# Patient Record
Sex: Female | Born: 1960 | Race: White | Hispanic: No | Marital: Married | Smoking: Former smoker
Health system: Southern US, Community
[De-identification: ages and names within clinical notes are randomized; demographics above are authoritative.]

## PROBLEM LIST (undated history)

## (undated) DIAGNOSIS — R112 Nausea with vomiting, unspecified: Secondary | ICD-10-CM

## (undated) DIAGNOSIS — Z923 Personal history of irradiation: Secondary | ICD-10-CM

## (undated) DIAGNOSIS — E785 Hyperlipidemia, unspecified: Secondary | ICD-10-CM

## (undated) DIAGNOSIS — Z9889 Other specified postprocedural states: Secondary | ICD-10-CM

## (undated) DIAGNOSIS — C50912 Malignant neoplasm of unspecified site of left female breast: Secondary | ICD-10-CM

## (undated) DIAGNOSIS — K219 Gastro-esophageal reflux disease without esophagitis: Secondary | ICD-10-CM

## (undated) DIAGNOSIS — H409 Unspecified glaucoma: Secondary | ICD-10-CM

## (undated) HISTORY — PX: CATARACT EXTRACTION W/ INTRAOCULAR LENS IMPLANT: SHX1309

## (undated) HISTORY — DX: Unspecified glaucoma: H40.9

## (undated) HISTORY — PX: BREAST BIOPSY: SHX20

## (undated) HISTORY — DX: Gastro-esophageal reflux disease without esophagitis: K21.9

## (undated) HISTORY — DX: Personal history of irradiation: Z92.3

## (undated) HISTORY — DX: Hyperlipidemia, unspecified: E78.5

---

## 1997-09-29 ENCOUNTER — Other Ambulatory Visit: Admission: RE | Admit: 1997-09-29 | Discharge: 1997-09-29 | Payer: Self-pay | Admitting: Oncology

## 1998-05-14 ENCOUNTER — Other Ambulatory Visit: Admission: RE | Admit: 1998-05-14 | Discharge: 1998-05-14 | Payer: Self-pay | Admitting: Obstetrics and Gynecology

## 1999-10-08 ENCOUNTER — Other Ambulatory Visit: Admission: RE | Admit: 1999-10-08 | Discharge: 1999-10-08 | Payer: Self-pay | Admitting: Obstetrics and Gynecology

## 2000-11-20 ENCOUNTER — Other Ambulatory Visit: Admission: RE | Admit: 2000-11-20 | Discharge: 2000-11-20 | Payer: Self-pay | Admitting: Obstetrics and Gynecology

## 2001-12-13 ENCOUNTER — Other Ambulatory Visit: Admission: RE | Admit: 2001-12-13 | Discharge: 2001-12-13 | Payer: Self-pay | Admitting: Obstetrics and Gynecology

## 2003-01-16 ENCOUNTER — Other Ambulatory Visit: Admission: RE | Admit: 2003-01-16 | Discharge: 2003-01-16 | Payer: Self-pay | Admitting: Obstetrics and Gynecology

## 2004-01-31 ENCOUNTER — Other Ambulatory Visit: Admission: RE | Admit: 2004-01-31 | Discharge: 2004-01-31 | Payer: Self-pay | Admitting: Obstetrics and Gynecology

## 2005-06-11 ENCOUNTER — Other Ambulatory Visit: Admission: RE | Admit: 2005-06-11 | Discharge: 2005-06-11 | Payer: Self-pay | Admitting: Obstetrics and Gynecology

## 2006-04-07 ENCOUNTER — Ambulatory Visit: Payer: Self-pay | Admitting: Family Medicine

## 2017-01-06 ENCOUNTER — Other Ambulatory Visit: Payer: Self-pay | Admitting: Obstetrics and Gynecology

## 2017-01-06 DIAGNOSIS — R928 Other abnormal and inconclusive findings on diagnostic imaging of breast: Secondary | ICD-10-CM

## 2017-01-23 ENCOUNTER — Other Ambulatory Visit: Payer: Self-pay

## 2017-01-23 ENCOUNTER — Other Ambulatory Visit: Payer: Self-pay | Admitting: Obstetrics and Gynecology

## 2017-01-23 ENCOUNTER — Ambulatory Visit
Admission: RE | Admit: 2017-01-23 | Discharge: 2017-01-23 | Disposition: A | Discharge status: Home / Self Care | Payer: BLUE CROSS/BLUE SHIELD | Source: Ambulatory Visit | Attending: Obstetrics and Gynecology | Admitting: Obstetrics and Gynecology

## 2017-01-23 DIAGNOSIS — R928 Other abnormal and inconclusive findings on diagnostic imaging of breast: Secondary | ICD-10-CM

## 2017-01-23 DIAGNOSIS — N6489 Other specified disorders of breast: Secondary | ICD-10-CM

## 2017-01-24 DIAGNOSIS — C50912 Malignant neoplasm of unspecified site of left female breast: Secondary | ICD-10-CM

## 2017-01-24 HISTORY — DX: Malignant neoplasm of unspecified site of left female breast: C50.912

## 2017-02-10 ENCOUNTER — Ambulatory Visit
Admission: RE | Admit: 2017-02-10 | Discharge: 2017-02-10 | Disposition: A | Discharge status: Home / Self Care | Payer: BLUE CROSS/BLUE SHIELD | Source: Ambulatory Visit | Attending: Obstetrics and Gynecology | Admitting: Obstetrics and Gynecology

## 2017-02-10 ENCOUNTER — Other Ambulatory Visit: Payer: Self-pay | Admitting: Obstetrics and Gynecology

## 2017-02-10 DIAGNOSIS — N6489 Other specified disorders of breast: Secondary | ICD-10-CM

## 2017-02-10 DIAGNOSIS — C50919 Malignant neoplasm of unspecified site of unspecified female breast: Secondary | ICD-10-CM

## 2017-02-10 HISTORY — DX: Malignant neoplasm of unspecified site of unspecified female breast: C50.919

## 2017-02-10 HISTORY — PX: BREAST BIOPSY: SHX20

## 2017-02-12 ENCOUNTER — Telehealth: Payer: Self-pay | Admitting: Hematology

## 2017-02-12 ENCOUNTER — Encounter: Payer: Self-pay | Admitting: *Deleted

## 2017-02-12 NOTE — Telephone Encounter (Signed)
Confirmed with patient [facility] appointment for 9/26 in the afternoon, will mail patient intake form

## 2017-02-17 ENCOUNTER — Other Ambulatory Visit: Payer: Self-pay | Admitting: *Deleted

## 2017-02-17 DIAGNOSIS — Z170 Estrogen receptor positive status [ER+]: Principal | ICD-10-CM

## 2017-02-17 DIAGNOSIS — C50212 Malignant neoplasm of upper-inner quadrant of left female breast: Secondary | ICD-10-CM | POA: Insufficient documentation

## 2017-02-18 ENCOUNTER — Ambulatory Visit (HOSPITAL_BASED_OUTPATIENT_CLINIC_OR_DEPARTMENT_OTHER): Payer: BLUE CROSS/BLUE SHIELD | Admitting: Hematology

## 2017-02-18 ENCOUNTER — Other Ambulatory Visit (HOSPITAL_BASED_OUTPATIENT_CLINIC_OR_DEPARTMENT_OTHER): Payer: BLUE CROSS/BLUE SHIELD

## 2017-02-18 ENCOUNTER — Encounter: Payer: Self-pay | Admitting: Physical Therapy

## 2017-02-18 ENCOUNTER — Ambulatory Visit
Admission: RE | Admit: 2017-02-18 | Discharge: 2017-02-18 | Disposition: A | Discharge status: Home / Self Care | Payer: BLUE CROSS/BLUE SHIELD | Source: Ambulatory Visit | Attending: Radiation Oncology | Admitting: Radiation Oncology

## 2017-02-18 ENCOUNTER — Encounter: Payer: Self-pay | Admitting: Hematology

## 2017-02-18 ENCOUNTER — Ambulatory Visit: Payer: BLUE CROSS/BLUE SHIELD | Attending: Surgery | Admitting: Physical Therapy

## 2017-02-18 ENCOUNTER — Ambulatory Visit: Payer: Self-pay | Admitting: Surgery

## 2017-02-18 VITALS — BP 131/75 | HR 65 | Temp 98.3°F | Resp 18 | Ht 65.0 in | Wt 198.2 lb

## 2017-02-18 DIAGNOSIS — C50212 Malignant neoplasm of upper-inner quadrant of left female breast: Secondary | ICD-10-CM

## 2017-02-18 DIAGNOSIS — Z510 Encounter for antineoplastic radiation therapy: Secondary | ICD-10-CM | POA: Insufficient documentation

## 2017-02-18 DIAGNOSIS — Z803 Family history of malignant neoplasm of breast: Secondary | ICD-10-CM

## 2017-02-18 DIAGNOSIS — Z170 Estrogen receptor positive status [ER+]: Secondary | ICD-10-CM

## 2017-02-18 DIAGNOSIS — R293 Abnormal posture: Secondary | ICD-10-CM | POA: Insufficient documentation

## 2017-02-18 DIAGNOSIS — Z801 Family history of malignant neoplasm of trachea, bronchus and lung: Secondary | ICD-10-CM | POA: Diagnosis not present

## 2017-02-18 DIAGNOSIS — C50312 Malignant neoplasm of lower-inner quadrant of left female breast: Secondary | ICD-10-CM

## 2017-02-18 LAB — CBC WITH DIFFERENTIAL/PLATELET
BASO%: 0.3 % (ref 0.0–2.0)
BASOS ABS: 0 10*3/uL (ref 0.0–0.1)
EOS%: 2.5 % (ref 0.0–7.0)
Eosinophils Absolute: 0.2 10*3/uL (ref 0.0–0.5)
HEMATOCRIT: 40.1 % (ref 34.8–46.6)
HGB: 13.4 g/dL (ref 11.6–15.9)
LYMPH#: 2.3 10*3/uL (ref 0.9–3.3)
LYMPH%: 38.9 % (ref 14.0–49.7)
MCH: 30.8 pg (ref 25.1–34.0)
MCHC: 33.4 g/dL (ref 31.5–36.0)
MCV: 92.2 fL (ref 79.5–101.0)
MONO#: 0.5 10*3/uL (ref 0.1–0.9)
MONO%: 8 % (ref 0.0–14.0)
NEUT#: 3 10*3/uL (ref 1.5–6.5)
NEUT%: 50.3 % (ref 38.4–76.8)
PLATELETS: 163 10*3/uL (ref 145–400)
RBC: 4.35 10*6/uL (ref 3.70–5.45)
RDW: 12.6 % (ref 11.2–14.5)
WBC: 6 10*3/uL (ref 3.9–10.3)

## 2017-02-18 LAB — COMPREHENSIVE METABOLIC PANEL
ALT: 19 U/L (ref 0–55)
ANION GAP: 8 meq/L (ref 3–11)
AST: 20 U/L (ref 5–34)
Albumin: 4.1 g/dL (ref 3.5–5.0)
Alkaline Phosphatase: 55 U/L (ref 40–150)
BUN: 14 mg/dL (ref 7.0–26.0)
CALCIUM: 9.6 mg/dL (ref 8.4–10.4)
CHLORIDE: 106 meq/L (ref 98–109)
CO2: 27 mEq/L (ref 22–29)
Creatinine: 0.8 mg/dL (ref 0.6–1.1)
EGFR: 77 mL/min/{1.73_m2} — AB (ref >90)
Glucose: 95 mg/dl (ref 70–140)
POTASSIUM: 4.3 meq/L (ref 3.5–5.1)
Sodium: 140 mEq/L (ref 136–145)
Total Bilirubin: 0.64 mg/dL (ref 0.20–1.20)
Total Protein: 7.6 g/dL (ref 6.4–8.3)

## 2017-02-18 NOTE — Progress Notes (Signed)
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 female who presents to multidisciplinary breast clinic for evaluation of left-sided breast cancer. Patient underwent routine screening mammogram on 01/05/17 revealing possible distortion in the left breast. She returned for screening recall on 01/23/17 revealing a small area of architectural distortion in the posterior upper left breast at approximately 11 o'clock. Ultrasound showed negative left axilla. biopsy of the upper inner quadrant mass on 02/11/17 revealed invasive ductal carcinoma. Receptor status is ER 90%, PR 0%, Her2-, and Ki67 2%. Patient presents to [facility] to discuss her treatment options and the role of radiation therapy as part of her treatment plan.  Patient complains of breast pain related to biopsy. All other systems are negative.  PREVIOUS RADIATION THERAPY: No  PAST MEDICAL HISTORY:  has a past medical history of Glaucoma.    PAST SURGICAL HISTORY: Past Surgical History:  Procedure Laterality Date  . CATARACT EXTRACTION      FAMILY HISTORY: family history includes Breast cancer in her maternal aunt; Lung cancer in her mother.  SOCIAL HISTORY:  reports that she has quit smoking. She has never used smokeless tobacco. She reports that she drinks about 4.8 oz of alcohol per week .  ALLERGIES: Patient has no allergy information on record.  MEDICATIONS:  No current outpatient prescriptions on file.   No current facility-administered medications for this encounter.     REVIEW OF SYSTEMS:A 10+ point review of systems  was obtained including neurology, dermatology, psychiatry, cardiac, respiratory, lymph, extremities, GI, GU, musculoskeletal, constitutional, reproductive, HEENT. All pertinent positives are noted in the HPI. All others are negative.   PHYSICAL EXAM:  Vitals with BMI 02/18/2017  Height 5' 5"  Weight 198 lbs 3 oz  BMI 82.95  Systolic 621  Diastolic 75  Pulse 65  Respirations 18  Lungs are clear to auscultation bilaterally. Heart has regular rate and rhythm. No palpable cervical, supraclavicular, or axillary adenopathy. Abdomen soft, non-tender, normal bowel sounds. Right breast has no palpable mass or nipple discharge. Left breast has bruising in the upper inner quadrant with no palpable mass, nipple discharge or bleeding.  ECOG = 0  0 - Asymptomatic (Fully active, able to carry on all predisease activities without restriction)  1 - Symptomatic but completely ambulatory (Restricted in physically strenuous activity but ambulatory and able to carry out work of a light or sedentary nature. For example, light housework, office work)  2 - Symptomatic, <50% in bed during the day (Ambulatory and capable of all self care but unable to carry out any work activities. Up and about more than 50% of waking hours)  3 - Symptomatic, >50% in bed, but not bedbound (Capable of only limited self-care, confined to bed or chair 50% or more of waking hours)  4 - Bedbound (Completely disabled. Cannot carry on any self-care. Totally confined to bed or chair)  5 - Death   Eustace Pen MM, Creech RH, Tormey DC, et al. [phone]). "Toxicity and response criteria of the [facility]". [place] Oncol. 5 (6): 649-55  LABORATORY DATA:  Lab Results  Component  Value Date   WBC 6.0 02/18/2017   HGB 13.4 02/18/2017   HCT 40.1 02/18/2017   MCV 92.2 02/18/2017   PLT 163 02/18/2017   NEUTROABS 3.0 02/18/2017   Lab Results  Component Value Date   NA 140 02/18/2017   K 4.3 02/18/2017   CO2 27  02/18/2017   GLUCOSE 95 02/18/2017   CREATININE 0.8 02/18/2017   CALCIUM 9.6 02/18/2017      RADIOGRAPHY: US Breast Ltd Uni Left Inc Axilla  Result Date: 01/23/2017 CLINICAL DATA:  Screening recall for a possible architectural distortion in the left breast. EXAM: 2D DIGITAL DIAGNOSTIC LEFT MAMMOGRAM WITH CAD AND ADJUNCT TOMO ULTRASOUND LEFT BREAST COMPARISON:  Previous exam(s). ACR Breast Density Category c: The breast tissue is heterogeneously dense, which may obscure small masses. FINDINGS: The possible architectural distortion, which lies in the posterior upper left breast, persists on the diagnostic spot-compression 3D CC images. It is not well seen on the diagnostic 3D MLO images. This lies slightly medial to midline in the superior left breast near the 11 o'clock position. There is no discrete mass. There are no suspicious calcifications. Mammographic images were processed with CAD. Targeted ultrasound is performed, showing normal fibroglandular tissue in the upper left breast. No mass, cyst or lesion. No sonographic architectural distortion. Sonographic evaluation of the left axilla shows no enlarged or abnormal lymph nodes. IMPRESSION: Small area of architectural distortion in the posterior upper left breast at approximately 11 o'clock. Tissue sampling is recommended. RECOMMENDATION: Stereotactic core needle biopsy of the area of architectural distortion in the left breast. I have discussed the findings and recommendations with the patient. Results were also provided in writing at the conclusion of the visit. If applicable, a reminder letter will be sent to the patient regarding the next appointment. BI-RADS CATEGORY  4: Suspicious. Electronically Signed   By: Lajean Manes M.D.   On: 01/23/2017 14:55   Mm Diag Breast Tomo Uni Left  Result Date: 01/23/2017 CLINICAL DATA:  Screening recall for a possible architectural distortion in the left breast. EXAM: 2D DIGITAL DIAGNOSTIC LEFT MAMMOGRAM WITH  CAD AND ADJUNCT TOMO ULTRASOUND LEFT BREAST COMPARISON:  Previous exam(s). ACR Breast Density Category c: The breast tissue is heterogeneously dense, which may obscure small masses. FINDINGS: The possible architectural distortion, which lies in the posterior upper left breast, persists on the diagnostic spot-compression 3D CC images. It is not well seen on the diagnostic 3D MLO images. This lies slightly medial to midline in the superior left breast near the 11 o'clock position. There is no discrete mass. There are no suspicious calcifications. Mammographic images were processed with CAD. Targeted ultrasound is performed, showing normal fibroglandular tissue in the upper left breast. No mass, cyst or lesion. No sonographic architectural distortion. Sonographic evaluation of the left axilla shows no enlarged or abnormal lymph nodes. IMPRESSION: Small area of architectural distortion in the posterior upper left breast at approximately 11 o'clock. Tissue sampling is recommended. RECOMMENDATION: Stereotactic core needle biopsy of the area of architectural distortion in the left breast. I have discussed the findings and recommendations with the patient. Results were also provided in writing at the conclusion of the visit. If applicable, a reminder letter will be sent to the patient regarding the next appointment. BI-RADS CATEGORY  4: Suspicious. Electronically Signed   By: Lajean Manes M.D.   On: 01/23/2017 14:55   Mm Clip Placement Left  Result Date: 02/10/2017 CLINICAL DATA:  Patient status post stereotactic guided biopsy left breast distortion. EXAM:  DIAGNOSTIC LEFT MAMMOGRAM POST STEREOTACTIC BIOPSY COMPARISON:  Previous exam(s). FINDINGS: Mammographic images were obtained following stereotactic guided biopsy of left breast distortion. Coil shaped marking clip in appropriate position within the upper-inner left breast posterior depth. Note the focal area of distortion extends approximately 1 cm cranial to the  clip on the true lateral view. IMPRESSION: Biopsy marking clip within appropriate position. Note the distortion extends approximately 1 cm superior to the biopsy marking clip on the ML view. Final Assessment: Post Procedure Mammograms for Marker Placement Electronically Signed   By: Lovey Newcomer M.D.   On: 02/10/2017 10:07   Mm Lt Breast Bx W Loc Dev 1st Lesion Image Bx Spec Stereo Guide  Addendum Date: 02/13/2017   ADDENDUM REPORT: 02/12/2017 11:30 ADDENDUM: Pathology revealed a microscopic focus of grade I invasive ductal carcinoma in the LEFT breast. This was found to be concordant by Dr. Lovey Newcomer. Pathology results were discussed with the patient by telephone. The patient reported doing well after the biopsy with tenderness at the site. Post biopsy instructions and care were reviewed and questions were answered. The patient was encouraged to call [facility] for any additional concerns. The patient was referred to the [facility] at the [facility] on February 18, 2017. Pathology results reported by Susa Raring RN, BSN on 02/12/2017. Electronically Signed   By: Lovey Newcomer M.D.   On: 02/12/2017 11:30   Result Date: 02/13/2017 CLINICAL DATA:  Patient with left breast distortion. EXAM: LEFT BREAST STEREOTACTIC CORE NEEDLE BIOPSY COMPARISON:  Previous exams. FINDINGS: The patient and I discussed the procedure of stereotactic-guided biopsy including benefits and alternatives. We discussed the high likelihood of a successful procedure. We discussed the risks of the procedure including infection, bleeding, tissue injury, clip migration, and inadequate sampling. Informed written consent was given. The usual time out protocol was performed immediately prior to the procedure. Using sterile technique and 1% Lidocaine as local anesthetic, under stereotactic guidance, a 9 gauge vacuum assisted device was used to perform core needle biopsy of  distortion within the upper inner left breast using a cranial approach. Lesion quadrant: Upper inner quadrant At the conclusion of the procedure, a coil shaped tissue marker clip was deployed into the biopsy cavity. Follow-up 2-view mammogram was performed and dictated separately. IMPRESSION: Stereotactic-guided biopsy of left breast distortion. No apparent complications. Electronically Signed: By: Lovey Newcomer M.D. On: 02/10/2017 10:05      IMPRESSION: 56 y.o. woman with stage 1 invasive ductal carcinoma in the upper inner quadrant of the left breast; ER+, PR-, Her 2-, grade 1. Patient would be a good candidate for breast conservation surgery followed by adjuvant radiotherapy. We discussed the course of treatment, side effects, and potential toxicities. The patient appears to understand and wishes to proceed with treatment.    PLAN: Patient will be scheduled for left lumpectomy sentinel node procedure followed by oncotyping, dependent upon the size of the lesion. This will be followed by adjuvant radiation therapy and an aromatase inhibitor.      ------------------------------------------------  Blair Promise, PhD, MD  This document serves as a record of services personally performed by Gery Pray, MD. It was created on his behalf by Bethann Humble, a trained medical scribe. The creation of this record is based on the scribe's personal observations and the provider's statements to them. This document has been checked and approved by the attending provider.

## 2017-02-18 NOTE — H&P (Signed)
[name] 02/18/2017 8:10 AM Location: [facility] Patient #: [id number] DOB: 09-16-1960 Undefined / Language: [name] / Race: Undefined Female  History of Present Illness [name]. Shelby Towle MD; 02/18/2017 3:03 PM) Patient words: Pt sent at the request of Dr Sondra Come for left breast mammographic abnormality. 1 cm area of distortion bx showed IDC ER/PR POS HER 2 NEU NEGATIVE  Pt denies breast pain discharge or change to breast bilaterally              ADDITIONAL INFORMATION: FLUORESCENCE IN-SITU HYBRIDIZATION Results: HER2 - NEGATIVE RATIO OF HER2/CEP17 SIGNALS 1.15 AVERAGE HER2 COPY NUMBER PER CELL 1.90 Reference Range: NEGATIVE HER2/CEP17 Ratio <2.0 and average HER2 copy number <4.0 EQUIVOCAL HER2/CEP17 Ratio <2.0 and average HER2 copy number >=4.0 and <6.0 POSITIVE HER2/CEP17 Ratio >=2.0 or <2.0 and average HER2 copy number >=6.0 Shelby Laws MD Pathologist, Electronic Signature ( Signed 02/16/2017) PROGNOSTIC INDICATORS Results: IMMUNOHISTOCHEMICAL AND MORPHOMETRIC ANALYSIS PERFORMED MANUALLY Estrogen Receptor: 90%, POSITIVE, STRONG STAINING INTENSITY Progesterone Receptor: 0%, NEGATIVE Proliferation Marker Ki67: 2% COMMENT: The negative hormone receptor study(ies) in this case has an internal positive control. REFERENCE RANGE ESTROGEN RECEPTOR NEGATIVE 0% POSITIVE =>1% REFERENCE RANGE PROGESTERONE RECEPTOR NEGATIVE 0% 1 of 3 FINAL for [name] [phone]) ADDITIONAL INFORMATION:(continued) POSITIVE =>1% All controls stained appropriately Shelby Laws MD Pathologist, Electronic Signature ( Signed 02/12/2017) FINAL DIAGNOSIS Diagnosis Breast, left, needle core biopsy, upper inner - INVASIVE DUCTAL CARCINOMA. - SEE COMMENT. Microscopic Comment There is Rivera microscopic focus of grade I invasive ductal carcinoma as confirmed by lack of myoepithelial cells based on smooth muscle myosin, calponin, and p63 stains. Dr [name]  has reviewed the case and concurs with this interpretation. Rivera full breast prognostic profile will be attempted and the results reported separately. The results were called to [facility] on 02/11/17. (JBK:gt:ecj, 02/11/17) Shelby Cutter MD Pathologist, Electronic Signature (Case signed 02/11/2017) Specimen Gross and Clinical Information Specimen Comment TIF: 9:55 AM; extracted < 5 min; distortion Specimen(s) Obtained: Breast, left, needle core biopsy, upper inner Specimen Clinical Information Poss radial scar vs CSL vs IDC Gross Received labeled "[name]" and "[name]" (TIF 0955 CIT <12mn) are multiple cores and irregular pieces of yellow to gray white soft tissue, aggregating 3.2 x 2.7 x 0.3 cm. Four blocks submitted. (SSW 9/18) Stain(s) used in Diagnosis: The following stain(s) were used in diagnosing the case: P63, PR-ACIS, KI-67-ACIS, Her2 FISH, ER-ACIS, Smooth Muscle Myosin - 1 Heavy Chain, Calponin. The control(s) stained appropriately. Disclaimer Ki-67 (MM1), immunohistochemical stains are performed on formalin fixed, paraffin embedded tissue using Rivera 3,3"-diaminobenzidine (DAB) chromogen and Leica Bond Autostainer System. The staining intensity of the nucleus is scored manually and is reported as the percentage of tumor cell nuclei demonstrating specific nuclear staining.Specimens are fixed in 10% Neutral Buffered Formalin for at least 6 hours and up to 72 hours. These tests have not be validated on decalcified tissue. Results should be interpreted with caution given the possibility of false negative results on 2 of 3 FINAL for [name] ([phone] Disclaimer(continued) decalcified specimens. Estrogen receptor (6F11), immunohistochemical stains are performed on formalin fixed, paraffin embedded tissue using Rivera 3,3"-diaminobenzidine (DAB) chromogen and Leica Bond Autostainer System. The staining intensity of the nucleus  is scored manually and is reported as the percentage of tumor cell nuclei demonstrating specific nuclear staining.Specimens are fixed in 10% Neutral Buffered Formalin for at least 6 hours and up to 72 hours. These tests have not be validated on  decalcified tissue. Results should be interpreted with caution given the possibility of false negative results on decalcified specimens. HER2 IQFISH pharmDX (code [phone]) is Rivera direct fluorescence in-situ hybridization assay designed to quantitatively determine HER2 gene amplification in formalin-fixed, paraffin-embedded tissue specimens. It is performed at [facility] and is reported using ASCO/CAP scoring criteria published in 2013. Some of these immunohistochemical stains may have been developed and the performance characteristics determined by [facility]. Some may not have been cleared or approved by the U.S. Food and Drug Administration. The FDA has determined that such clearance or approval is not necessary. This test is used for clinical purposes. It should not be regarded as investigational or for research. This laboratory is certified under the [place] (CLIA-88) as qualified to perform high complexity clinical laboratory testing. PR progesterone receptor (16), immunohistochemical stains are performed on formalin fixed, paraffin embedded tissue using Rivera 3,3"-diaminobenzidine (DAB) chromogen and Leica Bond Autostainer System. The staining intensity of the nucleus is scored manually and is reported as the percentage of tumor cell nuclei demonstrating specific nuclear staining.Specimens are fixed in 10% Neutral Buffered Formalin for at least 6 hours and up to 72 hours. These tests have not be validated on decalcified tissue. Results should be interpreted with caution given the possibility of false negative results on decalcified specimens. Report signed out from the following  location(s) Technical Component was performed at [facility]. [address],[place] [address].[id number]., Technical Component was performed at [facility] [address], [place], [place] [address]. CLIA #: [id number], Interpretation was performed at [facility], [place], [place] [address]. CLIA #: [id number], 3 of           CLINICAL DATA: Screening recall for Rivera possible architectural distortion in the left breast.  EXAM: 2D DIGITAL DIAGNOSTIC LEFT MAMMOGRAM WITH CAD AND ADJUNCT TOMO  ULTRASOUND LEFT BREAST  COMPARISON: Previous exam(s).  ACR Breast Density Category c: The breast tissue is heterogeneously dense, which may obscure small masses.  FINDINGS: The possible architectural distortion, which lies in the posterior upper left breast, persists on the diagnostic spot-compression 3D CC images. It is not well seen on the diagnostic 3D MLO images. This lies slightly medial to midline in the superior left breast near the 11 o'clock position. There is no discrete mass. There are no suspicious calcifications.  Mammographic images were processed with CAD.  Targeted ultrasound is performed, showing normal fibroglandular tissue in the upper left breast. No mass, cyst or lesion. No sonographic architectural distortion. Sonographic evaluation of the left axilla shows no enlarged or abnormal lymph nodes.  IMPRESSION: Small area of architectural distortion in the posterior upper left breast at approximately 11 o'clock. Tissue sampling is recommended.  RECOMMENDATION: Stereotactic core needle biopsy of the area of architectural distortion in the left breast.  I have discussed the findings and recommendations with the patient. Results were also provided in writing at the conclusion of the visit. If applicable, Rivera reminder letter will be sent to the patient regarding the next  appointment.  BI-RADS CATEGORY 4: Suspicious.   Electronically Signed By: Lajean Manes M.D. On: 01/23/2017 14:55.  The patient is Rivera 56 year old female.    Review of Systems (Aliah Eriksson Rivera. Dhiya Smits MD; 02/18/2017 2:57 PM) General Not Present- Appetite Loss, Chills, Fatigue, Fever, Night Sweats, Weight Gain and Weight Loss. Note: All other systems negative (unless as noted in HPI & included Review of Systems) Skin Not  Present- Change in Wart/Mole, Dryness, Hives, Jaundice, New Lesions, Non-Healing Wounds, Rash and Ulcer. Respiratory Not Present- Bloody sputum, Chronic Cough, Difficulty Breathing, Snoring and Wheezing. Cardiovascular Not Present- Chest Pain, Difficulty Breathing Lying Down, Leg Cramps, Palpitations, Rapid Heart Rate, Shortness of Breath and Swelling of Extremities. Gastrointestinal Not Present- Abdominal Pain, Bloating, Bloody Stool, Change in Bowel Habits, Chronic diarrhea, Constipation, Difficulty Swallowing, Excessive gas, Gets full quickly at meals, Hemorrhoids, Indigestion, Nausea, Rectal Pain and Vomiting. Musculoskeletal Not Present- Back Pain, Joint Pain, Joint Stiffness, Muscle Pain, Muscle Weakness and Swelling of Extremities. Neurological Not Present- Decreased Memory, Fainting, Headaches, Numbness, Seizures, Tingling, Tremor, Trouble walking and Weakness. Endocrine Not Present- Cold Intolerance, Excessive Hunger, Hair Changes, Heat Intolerance, Hot flashes and New Diabetes. Hematology Not Present- Easy Bruising, Excessive bleeding, Gland problems, HIV and Persistent Infections.   Physical Exam (Kiosha Buchan Rivera. Jasenia Weilbacher MD; 02/18/2017 3:04 PM)  General Mental Status-Alert. General Appearance-Consistent with stated age. Hydration-Well hydrated. Voice-Normal.  Head and Neck Head-normocephalic, atraumatic with no lesions or palpable masses. Trachea-midline. Thyroid Gland Characteristics - normal size and consistency.  Eye Eyeball -  Bilateral-Extraocular movements intact. Sclera/Conjunctiva - Bilateral-No scleral icterus.  Breast Note: bruising left breast no mass  right breast normal  Cardiovascular Cardiovascular examination reveals -normal heart sounds, regular rate and rhythm with no murmurs and normal pedal pulses bilaterally.  Musculoskeletal Normal Exam - Left-Upper Extremity Strength Normal and Lower Extremity Strength Normal. Normal Exam - Right-Upper Extremity Strength Normal and Lower Extremity Strength Normal.  Lymphatic Head & Neck  General Head & Neck Lymphatics: Bilateral - Description - Normal. Axillary  General Axillary Region: Bilateral - Description - Normal. Tenderness - Non Tender.    Assessment & Plan (Solana Coggin Rivera. Montrell Cessna MD; 02/18/2017 3:05 PM)  BREAST CANCER, LEFT (C50.912) Impression: Pt has opted for left breast partial mastectomy and SLN mapping Risk of lumpectomy include bleeding, infection, seroma, more surgery, use of seed/wire, wound care, cosmetic deformity and the need for other treatments, death , blood clots, death. Pt agrees to proceed. Risk of sentinel lymph node mapping include bleeding, infection, lymphedema, shoulder pain. stiffness, dye allergy. cosmetic deformity , blood clots, death, need for more surgery. Pt agres to proceed.  Current Plans You are being scheduled for surgery- Our schedulers will call you.  You should hear from our office's scheduling department within 5 working days about the location, date, and time of surgery. We try to make accommodations for patient's preferences in scheduling surgery, but sometimes the OR schedule or the surgeon's schedule prevents [place] from making those accommodations.  If you have not heard from our office [phone]) in 5 working days, call the office and ask for your surgeon's nurse.  If you have other questions about your diagnosis, plan, or surgery, call the office and ask for your surgeon's nurse.  Pt  Education - [facility] Breast Cancer Information Given - [facility] "Breast Journey" Package We discussed the staging and pathophysiology of breast cancer. We discussed all of the different options for treatment for breast cancer including surgery, chemotherapy, radiation therapy, Herceptin, and antiestrogen therapy. We discussed Rivera sentinel lymph node biopsy as she does not appear to having lymph node involvement right now. We discussed the performance of that with injection of radioactive tracer and blue dye. We discussed that she would have an incision underneath her axillary hairline. We discussed that there is Rivera bout Rivera 10-20% chance of having Rivera positive node with Rivera sentinel lymph node biopsy and we will await the permanent pathology to make  any other first further decisions in terms of her treatment. One of these options might be to return to the operating room to perform an axillary lymph node dissection. We discussed about Rivera 1-2% risk lifetime of chronic shoulder pain as well as lymphedema associated with Rivera sentinel lymph node biopsy. We discussed the options for treatment of the breast cancer which included lumpectomy versus Rivera mastectomy. We discussed the performance of the lumpectomy with Rivera wire placement. We discussed Rivera 10-20% chance of Rivera positive margin requiring reexcision in the operating room. We also discussed that she may need radiation therapy or antiestrogen therapy or both if she undergoes lumpectomy. We discussed the mastectomy and the postoperative care for that as well. We discussed that there is no difference in her survival whether she undergoes lumpectomy with radiation therapy or antiestrogen therapy versus Rivera mastectomy. There is Rivera slight difference in the local recurrence rate being 3-5% with lumpectomy and about 1% with Rivera mastectomy. We discussed the risks of operation including bleeding, infection, possible reoperation. She understands her further therapy will be based on what her stages at the  time of her operation.  Pt Education - [facility] breast cancer surgery: discussed with patient and provided information. Pt Education - ABC (After Breast Cancer) Class Info: discussed with patient and provided information.

## 2017-02-18 NOTE — Progress Notes (Signed)
[name]  [phone] Fax:([phone]) [place] Note   Patient Care Team: Hali Marry, MD as PCP - General Erroll Luna, MD as Consulting Physician (General Surgery) Truitt Merle, MD as Consulting Physician (Hematology) Gery Pray, MD as Consulting Physician (Radiation Oncology) 02/18/2017  CHIEF COMPLAINTS/PURPOSE OF CONSULTATION:  Left Breast Cancer    Oncology History   Cancer Staging Malignant neoplasm of upper-inner quadrant of left breast in female, estrogen receptor positive ([place]) Staging form: Breast, AJCC 8th Edition - Clinical stage from 02/10/2017: Stage Unknown (cTX, cN0, cM0, G1, ER: Positive, PR: Negative, HER2: Negative) - Signed by Truitt Merle, MD on 02/17/2017       Malignant neoplasm of upper-inner quadrant of left breast in female, estrogen receptor positive ([place])   01/23/2017 Mammogram    DIAGNOSTIC MAMMOGRAM AND US IMPRESSION: Small area of architectural distortion in the posterior upper left breast at approximately 11 o'clock. Tissue sampling is recommended. [place] of left breast and axilla was negative       02/10/2017 Receptors her2    Estrogen Receptor: 90%, POSITIVE, STRONG STAINING INTENSITY Progesterone Receptor: 0%, NEGATIVE HER2 NOT AMPLIFIED  Proliferation Marker Ki67: 2%       02/10/2017 Initial Biopsy    Breast, left, needle core biopsy, upper inner - INVASIVE DUCTAL CARCINOMA. GRADE 1      02/10/2017 Initial Diagnosis    Malignant neoplasm of upper-inner quadrant of left breast in female, estrogen receptor positive ([place])        HISTORY OF PRESENTING ILLNESS: 02/18/17 [name] 56 y.o. female is here because of newly diagnosed left breast cancer.  She presents to the Breast clinic today with her husband.   In the past she was diagnosed with glaucoma. She had a ovarian pregnancy in 1985 but was still able to have 2 children afterward. She smoked several years ago but no  longer. Her maternal aunt had breast cancer in her 53s. Her mother had lung caner from smoking.   Today she reports this lump was found by screening mammogram and she had yearly screening before that were normal. She denies any body change in weight, breast appearence, energy or appetite.  She takes vitamin Fish oil.    GYN HISTORY  Menarchal: 13 LMP: age 52 Contraceptive: 1977-2009 HRT: No GP: G5P3A2, miscarriage and ovarian pregnancy. 56 yo at first pregnancy. She has 1 daughter 1 son.     MEDICAL HISTORY:  Past Medical History:  Diagnosis Date  . Glaucoma     SURGICAL HISTORY: Past Surgical History:  Procedure Laterality Date  . CATARACT EXTRACTION      SOCIAL HISTORY: Social History   Social History  . Marital status: Married    Spouse name: N/A  . Number of children: N/A  . Years of education: N/A   Occupational History  . Not on file.   Social History Main Topics  . Smoking status: Former Research scientist (life sciences)  . Smokeless tobacco: Never Used  . Alcohol use 4.8 oz/week    8 Glasses of wine per week  . Drug use: Unknown  . Sexual activity: Not on file   Other Topics Concern  . Not on file   Social History Narrative  . No narrative on file    FAMILY HISTORY: Family History  Problem Relation Age of Onset  . Lung cancer Mother   . Breast cancer Maternal Aunt     ALLERGIES:  has no allergies on file.  MEDICATIONS:  No current outpatient  prescriptions on file.   No current facility-administered medications for this visit.     REVIEW OF SYSTEMS:   Constitutional: Denies fevers, chills or abnormal night sweats Eyes: Denies blurriness of vision, double vision or watery eyes Ears, nose, mouth, throat, and face: Denies mucositis or sore throat Respiratory: Denies cough, dyspnea or wheezes Cardiovascular: Denies palpitation, chest discomfort or lower extremity swelling Gastrointestinal:  Denies nausea, heartburn or change in bowel habits Skin: Denies abnormal  skin rashes Lymphatics: Denies new lymphadenopathy or easy bruising Neurological:Denies numbness, tingling or new weaknesses Behavioral/Psych: Mood is stable, no new changes  All other systems were reviewed with the patient and are negative.  PHYSICAL EXAMINATION: ECOG PERFORMANCE STATUS: 0 - Asymptomatic  Vitals:   02/18/17 1252  BP: 131/75  Pulse: 65  Resp: 18  Temp: 98.3 F (36.8 C)  SpO2: 100%   Filed Weights   02/18/17 1252  Weight: 198 lb 3.2 oz (89.9 kg)    GENERAL:alert, no distress and comfortable SKIN: skin color, texture, turgor are normal, no rashes or significant lesions EYES: normal, conjunctiva are pink and non-injected, sclera clear OROPHARYNX:no exudate, no erythema and lips, buccal mucosa, and tongue normal  NECK: supple, thyroid normal size, non-tender, without nodularity LYMPH:  no palpable lymphadenopathy in the cervical, axillary or inguinal LUNGS: clear to auscultation and percussion with normal breathing effort HEART: regular rate & rhythm and no murmurs and no lower extremity edema ABDOMEN:abdomen soft, non-tender and normal bowel sounds Musculoskeletal:no cyanosis of digits and no clubbing  PSYCH: alert & oriented x 3 with fluent speech NEURO: no focal motor/sensory deficits Breast: (+) Left breast has bruise at biopsy site, no palpable mass or adenopathy.   LABORATORY DATA:  I have reviewed the data as listed CBC Latest Ref Rng & Units 02/18/2017  WBC 3.9 - 10.3 10e3/uL 6.0  Hemoglobin 11.6 - 15.9 g/dL 13.4  Hematocrit 34.8 - 46.6 % 40.1  Platelets 145 - 400 10e3/uL 163    CMP Latest Ref Rng & Units 02/18/2017  Glucose 70 - 140 mg/dl 95  BUN 7.0 - 26.0 mg/dL 14.0  Creatinine 0.6 - 1.1 mg/dL 0.8  Sodium 136 - 145 mEq/L 140  Potassium 3.5 - 5.1 mEq/L 4.3  CO2 22 - 29 mEq/L 27  Calcium 8.4 - 10.4 mg/dL 9.6  Total Protein 6.4 - 8.3 g/dL 7.6  Total Bilirubin 0.20 - 1.20 mg/dL 0.64  Alkaline Phos 40 - 150 U/L 55  AST 5 - 34 U/L 20  ALT 0 -  55 U/L 19    PATHOLOGY   Diagnosis Breast, left, needle core biopsy, upper inner - INVASIVE DUCTAL CARCINOMA. - SEE COMMENT. Microscopic Comment There is a microscopic focus of grade I invasive ductal carcinoma as confirmed by lack of myoepithelial cells based on smooth muscle myosin, calponin, and p63 stains. Dr Claudette Laws has reviewed the case and concurs with this interpretation. A full breast prognostic profile will be attempted and the results reported separately. The results were called to [facility] on 02/11/17. (JBK:gt:ecj, 02/11/17) FLUORESCENCE IN-SITU HYBRIDIZATION Results: HER2 - NEGATIVE RATIO OF HER2/CEP17 SIGNALS 1.15 AVERAGE HER2 COPY NUMBER PER CELL 1.90 PROGNOSTIC INDICATORS Results: IMMUNOHISTOCHEMICAL AND MORPHOMETRIC ANALYSIS PERFORMED MANUALLY Estrogen Receptor: 90%, POSITIVE, STRONG STAINING INTENSITY Progesterone Receptor: 0%, NEGATIVE Proliferation Marker Ki67: 2%    RADIOGRAPHIC STUDIES: I have personally reviewed the radiological images as listed and agreed with the findings in the report. US Breast Ltd Uni Left Inc Axilla  Result Date: 01/23/2017 CLINICAL DATA:  Screening recall for a possible architectural distortion in the left breast. EXAM: 2D DIGITAL DIAGNOSTIC LEFT MAMMOGRAM WITH CAD AND ADJUNCT TOMO ULTRASOUND LEFT BREAST COMPARISON:  Previous exam(s). ACR Breast Density Category c: The breast tissue is heterogeneously dense, which may obscure small masses. FINDINGS: The possible architectural distortion, which lies in the posterior upper left breast, persists on the diagnostic spot-compression 3D CC images. It is not well seen on the diagnostic 3D MLO images. This lies slightly medial to midline in the superior left breast near the 11 o'clock position. There is no discrete mass. There are no suspicious calcifications. Mammographic images were processed with CAD. Targeted ultrasound is performed, showing normal fibroglandular  tissue in the upper left breast. No mass, cyst or lesion. No sonographic architectural distortion. Sonographic evaluation of the left axilla shows no enlarged or abnormal lymph nodes. IMPRESSION: Small area of architectural distortion in the posterior upper left breast at approximately 11 o'clock. Tissue sampling is recommended. RECOMMENDATION: Stereotactic core needle biopsy of the area of architectural distortion in the left breast. I have discussed the findings and recommendations with the patient. Results were also provided in writing at the conclusion of the visit. If applicable, a reminder letter will be sent to the patient regarding the next appointment. BI-RADS CATEGORY  4: Suspicious. Electronically Signed   By: Lajean Manes M.D.   On: 01/23/2017 14:55   Mm Diag Breast Tomo Uni Left  Result Date: 01/23/2017 CLINICAL DATA:  Screening recall for a possible architectural distortion in the left breast. EXAM: 2D DIGITAL DIAGNOSTIC LEFT MAMMOGRAM WITH CAD AND ADJUNCT TOMO ULTRASOUND LEFT BREAST COMPARISON:  Previous exam(s). ACR Breast Density Category c: The breast tissue is heterogeneously dense, which may obscure small masses. FINDINGS: The possible architectural distortion, which lies in the posterior upper left breast, persists on the diagnostic spot-compression 3D CC images. It is not well seen on the diagnostic 3D MLO images. This lies slightly medial to midline in the superior left breast near the 11 o'clock position. There is no discrete mass. There are no suspicious calcifications. Mammographic images were processed with CAD. Targeted ultrasound is performed, showing normal fibroglandular tissue in the upper left breast. No mass, cyst or lesion. No sonographic architectural distortion. Sonographic evaluation of the left axilla shows no enlarged or abnormal lymph nodes. IMPRESSION: Small area of architectural distortion in the posterior upper left breast at approximately 11 o'clock. Tissue sampling  is recommended. RECOMMENDATION: Stereotactic core needle biopsy of the area of architectural distortion in the left breast. I have discussed the findings and recommendations with the patient. Results were also provided in writing at the conclusion of the visit. If applicable, a reminder letter will be sent to the patient regarding the next appointment. BI-RADS CATEGORY  4: Suspicious. Electronically Signed   By: Lajean Manes M.D.   On: 01/23/2017 14:55   Mm Clip Placement Left  Result Date: 02/10/2017 CLINICAL DATA:  Patient status post stereotactic guided biopsy left breast distortion. EXAM: DIAGNOSTIC LEFT MAMMOGRAM POST STEREOTACTIC BIOPSY COMPARISON:  Previous exam(s). FINDINGS: Mammographic images were obtained following stereotactic guided biopsy of left breast distortion. Coil shaped marking clip in appropriate position within the upper-inner left breast posterior depth. Note the focal area of distortion extends approximately 1 cm cranial to the clip on the true lateral view. IMPRESSION: Biopsy marking clip within appropriate position. Note the distortion extends approximately 1 cm superior to the biopsy marking clip on the ML view. Final Assessment: Post Procedure Mammograms for Marker Placement Electronically Signed  By: Lovey Newcomer M.D.   On: 02/10/2017 10:07   Mm Lt Breast Bx W Loc Dev 1st Lesion Image Bx Spec Stereo Guide  Addendum Date: 02/13/2017   ADDENDUM REPORT: 02/12/2017 11:30 ADDENDUM: Pathology revealed a microscopic focus of grade I invasive ductal carcinoma in the LEFT breast. This was found to be concordant by Dr. Lovey Newcomer. Pathology results were discussed with the patient by telephone. The patient reported doing well after the biopsy with tenderness at the site. Post biopsy instructions and care were reviewed and questions were answered. The patient was encouraged to call [facility] for any additional concerns. The patient was referred to the [facility] at the [facility] on February 18, 2017. Pathology results reported by Susa Raring RN, BSN on 02/12/2017. Electronically Signed   By: Lovey Newcomer M.D.   On: 02/12/2017 11:30   Result Date: 02/13/2017 CLINICAL DATA:  Patient with left breast distortion. EXAM: LEFT BREAST STEREOTACTIC CORE NEEDLE BIOPSY COMPARISON:  Previous exams. FINDINGS: The patient and I discussed the procedure of stereotactic-guided biopsy including benefits and alternatives. We discussed the high likelihood of a successful procedure. We discussed the risks of the procedure including infection, bleeding, tissue injury, clip migration, and inadequate sampling. Informed written consent was given. The usual time out protocol was performed immediately prior to the procedure. Using sterile technique and 1% Lidocaine as local anesthetic, under stereotactic guidance, a 9 gauge vacuum assisted device was used to perform core needle biopsy of distortion within the upper inner left breast using a cranial approach. Lesion quadrant: Upper inner quadrant At the conclusion of the procedure, a coil shaped tissue marker clip was deployed into the biopsy cavity. Follow-up 2-view mammogram was performed and dictated separately. IMPRESSION: Stereotactic-guided biopsy of left breast distortion. No apparent complications. Electronically Signed: By: Lovey Newcomer M.D. On: 02/10/2017 10:05    ASSESSMENT & PLAN:  [name] is a 56 y.o. female with  A history of glaucoma.    1. Malignant neoplasm of upper-inner quadrant of left breast, invasive ductal carcinoma, c(TX, N0, M0), likely stage I, ER positive, PR Negative, HER2 Negative, Grade 1 -We discussed her imaging findings and the biopsy results in great details. -Giving the early stage disease, she likely need a lumpectomy and sentinel lymph node biopsy. She is agreeable with that. She was seen by Dr. Brantley Stage today and likely will proceed with  surgery soon.  -I recommend a Oncotype Dx test on the surgical sample if invasive cancer>16m,  and we'll make a decision about adjuvant chemotherapy based on the Oncotype result. Based on low grade and low KI67,  it is likely low risk disease and she will not need chemotherapy. Written material of this test was given to her. She is young and fit, would be a good candidate for chemotherapy if her Oncotype recurrence score is high. -If her surgical sentinel lymph node node positive, I recommend mammaprint for further risk stratification and guide adjuvant chemotherapy. -Giving the strong ER positive and PR negative expression in her postmenopausal status, I recommend adjuvant endocrine therapy with aromatase inhibitor for a total of 5-10 years to reduce the risk of cancer recurrence. Potential benefits and side effects were discussed with patient. She is concerned about those potential side effects, and react to take it. She will make a decision after the oncotype result returns  -She was also seen by radiation oncologist Dr. KSondra Cometoday. Adjuvant breast radiation after  lumpectomy was recommended.  -We also discussed the breast cancer surveillance after her surgery. She will continue annual screening mammogram, self exam, and a routine office visit with lab and exam with [place]. -I encouraged her to have healthy diet and exercise regularly.  -Labs reviewed and her CBC and CMP and Liver function are with in normal limits.  -Will f/u after surgery and radiation or sooner is oncocyte comes back high risk.   PLAN:  -Proceed with surgery lumpectomy and sentinel lymph node biopsy  -will order oncotype on surgical sample if invasive cancer more than 5 mm, all mammaprint if node positive -f/u after radiation, sooner if oncotype results show as high risk   No orders of the defined types were placed in this encounter.   All questions were answered. The patient knows to call the clinic with any problems,  questions or concerns. I spent 55 minutes counseling the patient face to face. The total time spent in the appointment was 60 minutes and more than 50% was on counseling.     Truitt Merle, MD 02/18/2017 5:04 PM   This document serves as a record of services personally performed by Truitt Merle, MD. It was created on her behalf by Joslyn Devon, a trained medical scribe. The creation of this record is based on the scribe's personal observations and the provider's statements to them. This document has been checked and approved by the attending provider.

## 2017-02-18 NOTE — Therapy (Addendum)
[place], [place], [address] Phone: [phone]   Fax:  [phone]  Physical Therapy Evaluation  Patient Details  Name: [name] MRN: [id number] Date of Birth: April 05, 1961 Referring Provider: Dr. Erroll Luna  Encounter Date: 02/18/2017      PT End of Session - 02/18/17 1523    Visit Number 1   Number of Visits 2   Date for PT Re-Evaluation 04/15/17   PT Start Time 1456   PT Stop Time 1516   PT Time Calculation (min) 20 min   Activity Tolerance Patient tolerated treatment well   Behavior During Therapy [facility] for tasks assessed/performed      Past Medical History:  Diagnosis Date  . Glaucoma     Past Surgical History:  Procedure Laterality Date  . CATARACT EXTRACTION      There were no vitals filed for this visit.       Subjective Assessment - 02/18/17 1518    Subjective Patient reports she is here today to be seen by her medical team for her newly diagnosed left breast cancer.   Patient is accompained by: Family member   Pertinent History Patient was diagnosed on 01/05/17 with left grade 1 invasive ductal carcinoma breast cancer. It is a distortion located in the upper innr quadrant with a Ki67 of 2%. It is ER positive, PR negative and HER2 negative.   Patient Stated Goals reduce lymphedema risk and learn post op shoulder ROM HEP   Currently in Pain? No/denies            [facility] PT Assessment - 02/18/17 0001      Assessment   Medical Diagnosis Left breast cancer   Referring Provider Dr. Marcello Moores [place]   Onset Date/Surgical Date 01/05/17   Hand Dominance Right   Prior Therapy none     Precautions   Precautions Other (comment)   Precaution Comments active cancer     Restrictions   Weight Bearing Restrictions No     Balance Screen   Has the patient fallen in the past 6 months No   Has the patient had a decrease in activity level because of a fear of falling?  No    Is the patient reluctant to leave their home because of a fear of falling?  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   Leisure She does not exercise     Cognition   Overall Cognitive Status Within Functional Limits for tasks assessed     Posture/Postural Control   Posture/Postural Control Postural limitations   Postural Limitations Rounded Shoulders;Forward head     ROM / Strength   AROM / PROM / Strength AROM;Strength     AROM   AROM Assessment Site Shoulder;Cervical   Right/Left Shoulder Right;Left   Right Shoulder Extension 58 Degrees   Right Shoulder Flexion 143 Degrees   Right Shoulder ABduction 157 Degrees   Right Shoulder Internal Rotation 87 Degrees   Right Shoulder External Rotation 76 Degrees   Left Shoulder Extension 54 Degrees   Left Shoulder Flexion 140 Degrees   Left Shoulder ABduction 152 Degrees   Left Shoulder Internal Rotation 75 Degrees   Left Shoulder External Rotation 79 Degrees  Cervical Flexion WNL   Cervical Extension WNL   Cervical - Right Side Bend WNL   Cervical - Left Side Bend WNL   Cervical - Right Rotation WNL   Cervical - Left Rotation WNL     Strength   Overall Strength Within functional limits for tasks performed           LYMPHEDEMA/ONCOLOGY QUESTIONNAIRE - 02/18/17 1522      Type   Cancer Type Left breast cancer     Lymphedema Assessments   Lymphedema Assessments Upper extremities     Right Upper Extremity Lymphedema   10 cm Proximal to Olecranon Process 34.3 cm   Olecranon Process 26.6 cm   10 cm Proximal to Ulnar Styloid Process 24.5 cm   Just Proximal to Ulnar Styloid Process 15.8 cm   Across Hand at [employer] 17.5 cm   At [place] of 2nd Digit 5.9 cm     Left Upper  Extremity Lymphedema   10 cm Proximal to Olecranon Process 33.4 cm   Olecranon Process 26.7 cm   10 cm Proximal to Ulnar Styloid Process 22.9 cm   Just Proximal to Ulnar Styloid Process 15.7 cm   Across Hand at [employer] 17.5 cm   At [place] of 2nd Digit 5.9 cm         Objective measurements completed on examination: See above findings.     Patient was instructed today in a home exercise program today for post op shoulder range of motion. These included active assist shoulder flexion in sitting, scapular retraction, wall walking with shoulder abduction, and hands behind head external rotation.  She was encouraged to do these twice a day, holding 3 seconds and repeating 5 times when permitted by her physician.                 PT Education - 02/18/17 1523    Education provided Yes   Education Details Lymphedema risk reduction and post op shoulder ROM HEP   Person(s) Educated Patient;Spouse   Methods Explanation;Demonstration;Handout   Comprehension Returned demonstration;Verbalized understanding              Breast Clinic Goals - 02/18/17 1525      Patient will be able to verbalize understanding of pertinent lymphedema risk reduction practices relevant to her diagnosis specifically related to skin care.   Time 1   Period Days   Status Achieved     Patient will be able to return demonstrate and/or verbalize understanding of the post-op home exercise program related to regaining shoulder range of motion.   Time 1   Period Days   Status Achieved     Patient will be able to verbalize understanding of the importance of attending the postoperative After Breast Cancer Class for further lymphedema risk reduction education and therapeutic exercise.   Time 1   Period Days   Status Achieved               Plan - 02/18/17 1523    Clinical Impression Statement Patient was diagnosed on 01/05/17 with left grade 1 invasive ductal carcinoma breast cancer. It is  a distortion located in the upper innr quadrant with a Ki67 of 2%. It is ER positive, PR negative and HER2 negative. Her multidisciplinary medical team met prior to her assessments to determine a recommended treatment plan. She is planning to have a left lumpectomy and sentinel node biopsy followed by Oncotype testing, radiation and anti-estrogen therapy. She may benefit from post  op PT to regain shoulder ROM and reduce lympehdema risk.   History and Personal Factors relevant to plan of care: None   Clinical Presentation Stable   Rehab Potential Excellent   Clinical Impairments Affecting Rehab Potential None   PT Frequency --  eval and 1 post op visit 3-4 after surgery   PT Treatment/Interventions ADLs/Self Care Home Management;Therapeutic exercise;Patient/family education   PT Next Visit Plan Will f.u 3-4 weeks post op to determine PT needs and reassess pt   PT Home Exercise Plan Post op shoulder ROM HEP   Consulted and Agree with Plan of Care Patient;Family member/caregiver   Family Member Consulted Husband      Patient will benefit from skilled therapeutic intervention in order to improve the following deficits and impairments:  Decreased range of motion, Impaired UE functional use, Pain, Decreased knowledge of precautions, Postural dysfunction  Visit Diagnosis: Carcinoma of upper-inner quadrant of left breast in female, estrogen receptor positive ([place]) - Plan: PT plan of care cert/re-cert  Abnormal posture - Plan: PT plan of care cert/re-cert   Patient will follow up at outpatient cancer rehab if needed following surgery.  If the patient requires physical therapy at that time, a specific plan will be dictated and sent to the referring physician for approval. The patient was educated today on appropriate basic range of motion exercises to begin post operatively and the importance of attending the After Breast Cancer class following surgery.  Patient was educated today on lymphedema risk  reduction practices as it pertains to recommendations that will benefit the patient immediately following surgery.  She verbalized good understanding.  No additional physical therapy is indicated at this time.      Problem List Patient Active Problem List   Diagnosis Date Noted  . Malignant neoplasm of upper-inner quadrant of left breast in female, estrogen receptor positive ([place]) 02/17/2017    Annia Friendly, PT 02/18/17 3:27 PM  [place], [place], [address] Phone: [phone]   Fax:  [phone]  Name: [name] MRN: [id number] Date of Birth: Dec 31, 1960   PHYSICAL THERAPY DISCHARGE SUMMARY  Visits from Start of Care: 1  Current functional level related to goals / functional outcomes: Unknown. Patient did not come for a f/u visit post operatively.   Remaining deficits: Unknown.   Education / Equipment: Lymphedema risk reduction and HEP education.  Plan: Patient agrees to discharge.  Patient goals were partially met. Patient is being discharged due to not returning since the last visit.  ?????         Annia Friendly, [place] 06/29/17 11:49 AM

## 2017-02-18 NOTE — Patient Instructions (Signed)

## 2017-02-20 ENCOUNTER — Other Ambulatory Visit: Payer: Self-pay | Admitting: *Deleted

## 2017-02-20 ENCOUNTER — Encounter: Payer: Self-pay | Admitting: General Practice

## 2017-02-20 ENCOUNTER — Telehealth: Payer: Self-pay | Admitting: Hematology

## 2017-02-20 NOTE — Progress Notes (Signed)
[place] Psychosocial Distress Screening [place] presented to Breast Multidisciplinary Clinic. The patient scored a 1 on the Psychosocial Distress Thermometer which indicates mild distress.   ONCBCN DISTRESS SCREENING 02/20/2017  Screening Type Initial Screening  Distress experienced in past week (1-10) 1  Referral to support programs Yes    Follow up needed: No. Pt received full packet of [place], but please also page if immediate needs arise.  Thank you.   [employer], [place], [facility] Pager [phone] Voicemail [phone]

## 2017-02-20 NOTE — Telephone Encounter (Signed)
No 9/26 los.   

## 2017-02-23 ENCOUNTER — Telehealth: Payer: Self-pay | Admitting: *Deleted

## 2017-02-23 HISTORY — PX: BREAST LUMPECTOMY: SHX2

## 2017-02-23 NOTE — Telephone Encounter (Signed)
Left vm for pt to return call regarding [place] from 02/18/17. Contact information provided.

## 2017-02-23 NOTE — Telephone Encounter (Signed)
Spoke to pt regarding [place] from 9.26.18. Denies questions or concerns regarding dx or treatment care plan. Encourage pt to call with needs. Received verbal understanding. Contact information provided.

## 2017-03-02 ENCOUNTER — Other Ambulatory Visit: Payer: Self-pay | Admitting: Surgery

## 2017-03-02 DIAGNOSIS — Z170 Estrogen receptor positive status [ER+]: Principal | ICD-10-CM

## 2017-03-02 DIAGNOSIS — C50312 Malignant neoplasm of lower-inner quadrant of left female breast: Secondary | ICD-10-CM

## 2017-03-17 ENCOUNTER — Encounter (HOSPITAL_BASED_OUTPATIENT_CLINIC_OR_DEPARTMENT_OTHER): Payer: Self-pay | Admitting: *Deleted

## 2017-03-17 NOTE — Pre-Procedure Instructions (Signed)
To come pick up Ensure pre-surgery drink 10 oz. - to drink by 0400 DOS. 

## 2017-03-23 ENCOUNTER — Ambulatory Visit
Admission: RE | Admit: 2017-03-23 | Discharge: 2017-03-23 | Disposition: A | Discharge status: Home / Self Care | Payer: BLUE CROSS/BLUE SHIELD | Source: Ambulatory Visit | Attending: Surgery | Admitting: Surgery

## 2017-03-23 DIAGNOSIS — Z170 Estrogen receptor positive status [ER+]: Principal | ICD-10-CM

## 2017-03-23 DIAGNOSIS — C50312 Malignant neoplasm of lower-inner quadrant of left female breast: Secondary | ICD-10-CM

## 2017-03-23 HISTORY — PX: BREAST LUMPECTOMY: SHX2

## 2017-03-23 NOTE — Progress Notes (Signed)
Pt in to pick up pre op drink, instructions reviewed.

## 2017-03-24 ENCOUNTER — Ambulatory Visit (HOSPITAL_BASED_OUTPATIENT_CLINIC_OR_DEPARTMENT_OTHER): Payer: BLUE CROSS/BLUE SHIELD | Admitting: Certified Registered"

## 2017-03-24 ENCOUNTER — Encounter (HOSPITAL_BASED_OUTPATIENT_CLINIC_OR_DEPARTMENT_OTHER): Payer: Self-pay | Admitting: Certified Registered"

## 2017-03-24 ENCOUNTER — Ambulatory Visit (HOSPITAL_BASED_OUTPATIENT_CLINIC_OR_DEPARTMENT_OTHER)
Admission: RE | Admit: 2017-03-24 | Discharge: 2017-03-24 | Disposition: A | Discharge status: Home / Self Care | Payer: BLUE CROSS/BLUE SHIELD | Source: Ambulatory Visit | Attending: Surgery | Admitting: Surgery

## 2017-03-24 ENCOUNTER — Ambulatory Visit
Admission: RE | Admit: 2017-03-24 | Discharge: 2017-03-24 | Disposition: A | Discharge status: Home / Self Care | Payer: BLUE CROSS/BLUE SHIELD | Source: Ambulatory Visit | Attending: Surgery | Admitting: Surgery

## 2017-03-24 ENCOUNTER — Encounter (HOSPITAL_BASED_OUTPATIENT_CLINIC_OR_DEPARTMENT_OTHER)
Admission: RE | Disposition: A | Discharge status: Home / Self Care | Payer: Self-pay | Source: Ambulatory Visit | Attending: Surgery

## 2017-03-24 ENCOUNTER — Ambulatory Visit (HOSPITAL_COMMUNITY)
Admission: RE | Admit: 2017-03-24 | Discharge: 2017-03-24 | Disposition: A | Discharge status: Home / Self Care | Payer: BLUE CROSS/BLUE SHIELD | Source: Ambulatory Visit | Attending: Surgery | Admitting: Surgery

## 2017-03-24 DIAGNOSIS — Z170 Estrogen receptor positive status [ER+]: Principal | ICD-10-CM

## 2017-03-24 DIAGNOSIS — C50212 Malignant neoplasm of upper-inner quadrant of left female breast: Secondary | ICD-10-CM | POA: Insufficient documentation

## 2017-03-24 DIAGNOSIS — C50312 Malignant neoplasm of lower-inner quadrant of left female breast: Secondary | ICD-10-CM

## 2017-03-24 DIAGNOSIS — Z87891 Personal history of nicotine dependence: Secondary | ICD-10-CM | POA: Diagnosis not present

## 2017-03-24 DIAGNOSIS — Z6834 Body mass index (BMI) 34.0-34.9, adult: Secondary | ICD-10-CM | POA: Insufficient documentation

## 2017-03-24 DIAGNOSIS — E669 Obesity, unspecified: Secondary | ICD-10-CM | POA: Insufficient documentation

## 2017-03-24 HISTORY — PX: BREAST LUMPECTOMY WITH RADIOACTIVE SEED AND SENTINEL LYMPH NODE BIOPSY: SHX6550

## 2017-03-24 HISTORY — DX: Malignant neoplasm of unspecified site of left female breast: C50.912

## 2017-03-24 HISTORY — DX: Nausea with vomiting, unspecified: R11.2

## 2017-03-24 HISTORY — DX: Other specified postprocedural states: Z98.890

## 2017-03-24 SURGERY — BREAST LUMPECTOMY WITH RADIOACTIVE SEED AND SENTINEL LYMPH NODE BIOPSY
Anesthesia: Regional | Site: Breast | Laterality: Left

## 2017-03-24 MED ORDER — IBUPROFEN 800 MG PO TABS: 800.0000 mg | ORAL_TABLET | Freq: Three times a day (TID) | ORAL | 0 refills | Status: DC | PRN

## 2017-03-24 MED ORDER — MIDAZOLAM HCL 2 MG/2ML IJ SOLN
INTRAMUSCULAR | Status: AC
  Filled 2017-03-24: qty 2

## 2017-03-24 MED ORDER — MIDAZOLAM HCL 2 MG/2ML IJ SOLN
1.0000 mg | INTRAMUSCULAR | Status: DC | PRN
  Administered 2017-03-24 (×2): 1 mg via INTRAVENOUS

## 2017-03-24 MED ORDER — BUPIVACAINE-EPINEPHRINE (PF) 0.25% -1:200000 IJ SOLN
INTRAMUSCULAR | Status: DC | PRN
  Administered 2017-03-24: 20 mL

## 2017-03-24 MED ORDER — CHLORHEXIDINE GLUCONATE CLOTH 2 % EX PADS: 6.0000 | MEDICATED_PAD | Freq: Once | CUTANEOUS | Status: DC

## 2017-03-24 MED ORDER — GABAPENTIN 300 MG PO CAPS
300.0000 mg | ORAL_CAPSULE | ORAL | Status: AC
  Administered 2017-03-24: 300 mg via ORAL

## 2017-03-24 MED ORDER — LIDOCAINE HCL (CARDIAC) 20 MG/ML IV SOLN
INTRAVENOUS | Status: DC | PRN
  Administered 2017-03-24: 30 mg via INTRAVENOUS

## 2017-03-24 MED ORDER — FENTANYL CITRATE (PF) 100 MCG/2ML IJ SOLN
50.0000 ug | INTRAMUSCULAR | Status: AC | PRN
  Administered 2017-03-24: 75 ug via INTRAVENOUS
  Administered 2017-03-24: 50 ug via INTRAVENOUS
  Administered 2017-03-24: 25 ug via INTRAVENOUS

## 2017-03-24 MED ORDER — CEFAZOLIN SODIUM-DEXTROSE 2-4 GM/100ML-% IV SOLN: 2.0000 g | Freq: Once | INTRAVENOUS | Status: DC

## 2017-03-24 MED ORDER — METHYLENE BLUE 0.5 % INJ SOLN
INTRAVENOUS | Status: AC
  Filled 2017-03-24: qty 10

## 2017-03-24 MED ORDER — FENTANYL CITRATE (PF) 100 MCG/2ML IJ SOLN
INTRAMUSCULAR | Status: AC
  Filled 2017-03-24: qty 2

## 2017-03-24 MED ORDER — LACTATED RINGERS IV SOLN
INTRAVENOUS | Status: DC
  Administered 2017-03-24 (×2): via INTRAVENOUS

## 2017-03-24 MED ORDER — ACETAMINOPHEN 500 MG PO TABS
ORAL_TABLET | ORAL | Status: AC
  Filled 2017-03-24: qty 2

## 2017-03-24 MED ORDER — ONDANSETRON HCL 4 MG/2ML IJ SOLN
INTRAMUSCULAR | Status: DC | PRN
  Administered 2017-03-24: 4 mg via INTRAVENOUS

## 2017-03-24 MED ORDER — CELECOXIB 200 MG PO CAPS
200.0000 mg | ORAL_CAPSULE | ORAL | Status: AC
  Administered 2017-03-24: 200 mg via ORAL

## 2017-03-24 MED ORDER — PROPOFOL 10 MG/ML IV BOLUS
INTRAVENOUS | Status: DC | PRN
  Administered 2017-03-24: 200 mg via INTRAVENOUS

## 2017-03-24 MED ORDER — ROPIVACAINE HCL 5 MG/ML IJ SOLN
INTRAMUSCULAR | Status: DC | PRN
  Administered 2017-03-24: 30 mL via PERINEURAL

## 2017-03-24 MED ORDER — CELECOXIB 200 MG PO CAPS
ORAL_CAPSULE | ORAL | Status: AC
  Filled 2017-03-24: qty 1

## 2017-03-24 MED ORDER — FENTANYL CITRATE (PF) 100 MCG/2ML IJ SOLN: 25.0000 ug | INTRAMUSCULAR | Status: DC | PRN

## 2017-03-24 MED ORDER — OXYCODONE HCL 5 MG PO TABS: 5.0000 mg | ORAL_TABLET | Freq: Four times a day (QID) | ORAL | 0 refills | Status: DC | PRN

## 2017-03-24 MED ORDER — ACETAMINOPHEN 500 MG PO TABS
1000.0000 mg | ORAL_TABLET | ORAL | Status: AC
  Administered 2017-03-24: 1000 mg via ORAL

## 2017-03-24 MED ORDER — ONDANSETRON HCL 4 MG/2ML IJ SOLN: 4.0000 mg | Freq: Once | INTRAMUSCULAR | Status: DC | PRN

## 2017-03-24 MED ORDER — TECHNETIUM TC 99M SULFUR COLLOID FILTERED
1.0000 | Freq: Once | INTRAVENOUS | Status: AC | PRN
  Administered 2017-03-24: 1 via INTRADERMAL

## 2017-03-24 MED ORDER — SODIUM CHLORIDE 0.9 % IJ SOLN
INTRAMUSCULAR | Status: AC
  Filled 2017-03-24: qty 10

## 2017-03-24 MED ORDER — CEFAZOLIN SODIUM 10 G IJ SOLR
3.0000 g | INTRAMUSCULAR | Status: DC
  Administered 2017-03-24: 2 g via INTRAVENOUS

## 2017-03-24 MED ORDER — EPHEDRINE SULFATE 50 MG/ML IJ SOLN
INTRAMUSCULAR | Status: DC | PRN
  Administered 2017-03-24: 10 mg via INTRAVENOUS

## 2017-03-24 MED ORDER — CEFAZOLIN SODIUM-DEXTROSE 2-4 GM/100ML-% IV SOLN
INTRAVENOUS | Status: AC
  Filled 2017-03-24: qty 100

## 2017-03-24 MED ORDER — SCOPOLAMINE 1 MG/3DAYS TD PT72
1.0000 | MEDICATED_PATCH | Freq: Once | TRANSDERMAL | Status: AC
  Administered 2017-03-24: 1 via TRANSDERMAL

## 2017-03-24 MED ORDER — GABAPENTIN 300 MG PO CAPS
ORAL_CAPSULE | ORAL | Status: AC
Start: 2017-03-24 — End: 2017-03-24
  Filled 2017-03-24: qty 1

## 2017-03-24 MED ORDER — BUPIVACAINE-EPINEPHRINE (PF) 0.25% -1:200000 IJ SOLN
INTRAMUSCULAR | Status: AC
  Filled 2017-03-24: qty 150

## 2017-03-24 MED ORDER — DEXAMETHASONE SODIUM PHOSPHATE 4 MG/ML IJ SOLN
INTRAMUSCULAR | Status: DC | PRN
  Administered 2017-03-24: 10 mg via INTRAVENOUS

## 2017-03-24 SURGICAL SUPPLY — 51 items
ADH SKN CLS APL DERMABOND .7 (GAUZE/BANDAGES/DRESSINGS) ×1
APPLIER CLIP 9.375 MED OPEN (MISCELLANEOUS) ×2
APR CLP MED 9.3 20 MLT OPN (MISCELLANEOUS) ×1
BINDER BREAST LRG (GAUZE/BANDAGES/DRESSINGS) IMPLANT
BINDER BREAST MEDIUM (GAUZE/BANDAGES/DRESSINGS) IMPLANT
BINDER BREAST XLRG (GAUZE/BANDAGES/DRESSINGS) ×1 IMPLANT
BINDER BREAST XXLRG (GAUZE/BANDAGES/DRESSINGS) IMPLANT
BLADE SURG 15 STRL LF DISP TIS (BLADE) ×1 IMPLANT
BLADE SURG 15 STRL SS (BLADE) ×2
CANISTER SUC SOCK COL 7IN (MISCELLANEOUS) IMPLANT
CANISTER SUCT 1200ML W/VALVE (MISCELLANEOUS) ×2 IMPLANT
CHLORAPREP W/TINT 26ML (MISCELLANEOUS) ×2 IMPLANT
CLIP APPLIE 9.375 MED OPEN (MISCELLANEOUS) ×1 IMPLANT
COVER BACK TABLE 60X90IN (DRAPES) ×2 IMPLANT
COVER MAYO STAND STRL (DRAPES) ×2 IMPLANT
COVER PROBE W GEL 5X96 (DRAPES) ×2 IMPLANT
DECANTER SPIKE VIAL GLASS SM (MISCELLANEOUS) IMPLANT
DERMABOND ADVANCED (GAUZE/BANDAGES/DRESSINGS) ×1
DERMABOND ADVANCED .7 DNX12 (GAUZE/BANDAGES/DRESSINGS) ×1 IMPLANT
DEVICE DUBIN W/COMP PLATE 8390 (MISCELLANEOUS) ×2 IMPLANT
DRAPE LAPAROSCOPIC ABDOMINAL (DRAPES) ×2 IMPLANT
DRAPE UTILITY XL STRL (DRAPES) ×2 IMPLANT
ELECT COATED BLADE 2.86 ST (ELECTRODE) ×2 IMPLANT
ELECT REM PT RETURN 9FT ADLT (ELECTROSURGICAL) ×2
ELECTRODE REM PT RTRN 9FT ADLT (ELECTROSURGICAL) ×1 IMPLANT
GLOVE BIOGEL PI IND STRL 8 (GLOVE) ×1 IMPLANT
GLOVE BIOGEL PI INDICATOR 8 (GLOVE) ×1
GLOVE SURG SS PI 6.5 STRL IVOR (GLOVE) ×1 IMPLANT
GLOVE SURG SS PI 7.0 STRL IVOR (GLOVE) ×1 IMPLANT
GLOVE SURG SS PI 8.0 STRL IVOR (GLOVE) ×1 IMPLANT
GOWN STRL REUS W/ TWL LRG LVL3 (GOWN DISPOSABLE) ×2 IMPLANT
GOWN STRL REUS W/TWL LRG LVL3 (GOWN DISPOSABLE) ×4
HEMOSTAT ARISTA ABSORB 3G PWDR (MISCELLANEOUS) IMPLANT
HEMOSTAT SNOW SURGICEL 2X4 (HEMOSTASIS) ×1 IMPLANT
KIT MARKER MARGIN INK (KITS) ×2 IMPLANT
NDL HYPO 25X1 1.5 SAFETY (NEEDLE) ×1 IMPLANT
NDL SAFETY ECLIPSE 18X1.5 (NEEDLE) IMPLANT
NEEDLE HYPO 18GX1.5 SHARP (NEEDLE)
NEEDLE HYPO 25X1 1.5 SAFETY (NEEDLE) ×2 IMPLANT
NS IRRIG 1000ML POUR BTL (IV SOLUTION) ×2 IMPLANT
PACK BASIN DAY SURGERY FS (CUSTOM PROCEDURE TRAY) ×2 IMPLANT
PENCIL BUTTON HOLSTER BLD 10FT (ELECTRODE) ×2 IMPLANT
SLEEVE SCD COMPRESS KNEE MED (MISCELLANEOUS) ×2 IMPLANT
SPONGE LAP 4X18 X RAY DECT (DISPOSABLE) ×2 IMPLANT
SUT MNCRL AB 4-0 PS2 18 (SUTURE) ×2 IMPLANT
SUT VICRYL 3-0 CR8 SH (SUTURE) ×2 IMPLANT
SYR CONTROL 10ML LL (SYRINGE) ×2 IMPLANT
TOWEL OR 17X24 6PK STRL BLUE (TOWEL DISPOSABLE) ×2 IMPLANT
TOWEL OR NON WOVEN STRL DISP B (DISPOSABLE) ×2 IMPLANT
TUBE CONNECTING 20X1/4 (TUBING) ×2 IMPLANT
YANKAUER SUCT BULB TIP NO VENT (SUCTIONS) ×2 IMPLANT

## 2017-03-24 NOTE — Transfer of Care (Signed)
Immediate Anesthesia Transfer of Care Note  Patient: Asharia Lotter Hamed  Procedure(s) Performed: BREAST PARTIAL MASTECTOMY WITH RADIOACTIVE SEED AND SENTINEL LYMPH NODE BIOPSY (Left Breast)  Patient Location: PACU  Anesthesia Type:GA combined with regional for post-op pain  Level of Consciousness: awake, alert , oriented and patient cooperative  Airway & Oxygen Therapy: Patient Spontanous Breathing and Patient connected to face mask oxygen  Post-op Assessment: Report given to RN and Post -op Vital signs reviewed and stable  Post vital signs: Reviewed and stable  Last Vitals:  Vitals:   03/24/17 0727 03/24/17 0728  BP: 120/87   Pulse: 63 63  Resp: 11 13  Temp:    SpO2: 100% 100%    Last Pain:  Vitals:   03/24/17 0650  TempSrc: Oral         Complications: No apparent anesthesia complications

## 2017-03-24 NOTE — Anesthesia Procedure Notes (Signed)
Procedure Name: LMA Insertion Date/Time: 03/24/2017 7:42 AM Performed by: Doretta Remmert D Pre-anesthesia Checklist: Patient identified, Emergency Drugs available, Suction available and Patient being monitored Patient Re-evaluated:Patient Re-evaluated prior to induction Oxygen Delivery Method: Circle system utilized Preoxygenation: Pre-oxygenation with 100% oxygen Induction Type: IV induction Ventilation: Mask ventilation without difficulty LMA: LMA inserted LMA Size: 3.0 Number of attempts: 1 Airway Equipment and Method: Bite block Placement Confirmation: positive ETCO2 Tube secured with: Tape Dental Injury: Teeth and Oropharynx as per pre-operative assessment

## 2017-03-24 NOTE — Anesthesia Preprocedure Evaluation (Addendum)
Anesthesia Evaluation  Patient identified by MRN, date of birth, ID band Patient awake    Reviewed: Allergy & Precautions, NPO status , Patient's Chart, lab work & pertinent test results  History of Anesthesia Complications (+) PONV and history of anesthetic complications  Airway Mallampati: II  TM Distance: >3 FB Neck ROM: Full    Dental no notable dental hx.    Pulmonary former smoker,    Pulmonary exam normal breath sounds clear to auscultation       Cardiovascular negative cardio ROS Normal cardiovascular exam Rhythm:Regular Rate:Normal     Neuro/Psych negative neurological ROS  negative psych ROS   GI/Hepatic negative GI ROS, Neg liver ROS,   Endo/Other  negative endocrine ROS  Renal/GU negative Renal ROS     Musculoskeletal negative musculoskeletal ROS (+)   Abdominal (+) + obese,   Peds  Hematology negative hematology ROS (+)   Anesthesia Other Findings LEFT BREAST CANCER  Reproductive/Obstetrics                            Anesthesia Physical Anesthesia Plan  ASA: II  Anesthesia Plan: General and Regional   Post-op Pain Management: GA combined w/ Regional for post-op pain   Induction: Intravenous  PONV Risk Score and Plan: 4 or greater and Ondansetron, Dexamethasone, Midazolam and Scopolamine patch - Pre-op  Airway Management Planned: LMA  Additional Equipment:   Intra-op Plan:   Post-operative Plan: Extubation in OR  Informed Consent: I have reviewed the patients History and Physical, chart, labs and discussed the procedure including the risks, benefits and alternatives for the proposed anesthesia with the patient or authorized representative who has indicated his/her understanding and acceptance.   Dental advisory given  Plan Discussed with: CRNA  Anesthesia Plan Comments:        Anesthesia Quick Evaluation

## 2017-03-24 NOTE — Interval H&P Note (Signed)
History and Physical Interval Note:  03/24/2017 7:18 AM  [name]  has presented today for surgery, with the diagnosis of LEFT BREAST CANCER  The various methods of treatment have been discussed with the patient and family. After consideration of risks, benefits and other options for treatment, the patient has consented to  Procedure(s): BREAST PARTIAL MASTECTOMY WITH RADIOACTIVE SEED AND SENTINEL LYMPH NODE BIOPSY (Left) as a surgical intervention .  The patient's history has been reviewed, patient examined, no change in status, stable for surgery.  I have reviewed the patient's chart and labs.  Questions were answered to the patient's satisfaction.     Eustacia Urbanek A.

## 2017-03-24 NOTE — Discharge Instructions (Signed)
[facility] °Office Phone Number [phone] ° °BREAST BIOPSY/ PARTIAL MASTECTOMY: POST OP INSTRUCTIONS ° °Always review your discharge instruction sheet given to you by the facility where your surgery was performed. ° °IF YOU HAVE DISABILITY OR FAMILY LEAVE FORMS, YOU MUST BRING THEM TO THE OFFICE FOR PROCESSING.  DO NOT GIVE THEM TO YOUR DOCTOR. ° °1. A prescription for pain medication may be given to you upon discharge.  Take your pain medication as prescribed, if needed.  If narcotic pain medicine is not needed, then you may take acetaminophen (Tylenol) or ibuprofen (Advil) as needed. °2. Take your usually prescribed medications unless otherwise directed °3. If you need a refill on your pain medication, please contact your pharmacy.  They will contact our office to request authorization.  Prescriptions will not be filled after 5pm or on week-ends. °4. You should eat very light the first 24 hours after surgery, such as soup, crackers, pudding, etc.  Resume your normal diet the day after surgery. °5. Most patients will experience some swelling and bruising in the breast.  Ice packs and a good support bra will help.  Swelling and bruising can take several days to resolve.  °6. It is common to experience some constipation if taking pain medication after surgery.  Increasing fluid intake and taking a stool softener will usually help or prevent this problem from occurring.  A mild laxative (Milk of Magnesia or Miralax) should be taken according to package directions if there are no bowel movements after 48 hours. °7. Unless discharge instructions indicate otherwise, you may remove your bandages 24-48 hours after surgery, and you may shower at that time.  You may have steri-strips (small skin tapes) in place directly over the incision.  These strips should be left on the skin for 7-10 days.  If your surgeon used skin glue on the incision, you may shower in 24 hours.  The glue will flake off over the  next 2-3 weeks.  Any sutures or staples will be removed at the office during your follow-up visit. °8. ACTIVITIES:  You may resume regular daily activities (gradually increasing) beginning the next day.  Wearing a good support bra or sports bra minimizes pain and swelling.  You may have sexual intercourse when it is comfortable. °a. You may drive when you no longer are taking prescription pain medication, you can comfortably wear a seatbelt, and you can safely maneuver your car and apply brakes. °b. RETURN TO WORK:  ______________________________________________________________________________________ °9. You should see your doctor in the office for a follow-up appointment approximately two weeks after your surgery.  Your doctor’s nurse will typically make your follow-up appointment when she calls you with your pathology report.  Expect your pathology report 2-3 business days after your surgery.  You may call to check if you do not hear from us after three days. °10. OTHER INSTRUCTIONS: _______________________________________________________________________________________________ _____________________________________________________________________________________________________________________________________ °_____________________________________________________________________________________________________________________________________ °_____________________________________________________________________________________________________________________________________ ° °WHEN TO CALL YOUR DOCTOR: °1. Fever over 101.0 °2. Nausea and/or vomiting. °3. Extreme swelling or bruising. °4. Continued bleeding from incision. °5. Increased pain, redness, or drainage from the incision. ° °The clinic staff is available to answer your questions during regular business hours.  Please don’t hesitate to call and ask to speak to one of the nurses for clinical concerns.  If you have a medical emergency, go to the nearest  emergency room or call 911.  A surgeon from [facility] is always on call at the hospital. ° °For further questions, please visit [email or web address]  ° ° ° ° °  Post Anesthesia Home Care Instructions ° °Activity: °Get plenty of rest for the remainder of the day. A responsible individual must stay with you for 24 hours following the procedure.  °For the next 24 hours, DO NOT: °-Drive a car °-Operate machinery °-Drink alcoholic beverages °-Take any medication unless instructed by your physician °-Make any legal decisions or sign important papers. ° °Meals: °Start with liquid foods such as gelatin or soup. Progress to regular foods as tolerated. Avoid greasy, spicy, heavy foods. If nausea and/or vomiting occur, drink only clear liquids until the nausea and/or vomiting subsides. Call your physician if vomiting continues. ° °Special Instructions/Symptoms: °Your throat may feel dry or sore from the anesthesia or the breathing tube placed in your throat during surgery. If this causes discomfort, gargle with warm salt water. The discomfort should disappear within 24 hours. ° °If you had a scopolamine patch placed behind your ear for the management of post- operative nausea and/or vomiting: ° °1. The medication in the patch is effective for 72 hours, after which it should be removed.  Wrap patch in a tissue and discard in the trash. Wash hands thoroughly with soap and water. °2. You may remove the patch earlier than 72 hours if you experience unpleasant side effects which may include dry mouth, dizziness or visual disturbances. °3. Avoid touching the patch. Wash your hands with soap and water after contact with the patch. °  ° °

## 2017-03-24 NOTE — Progress Notes (Signed)
Emotional support during breast injections °

## 2017-03-24 NOTE — H&P (Signed)
[name]  Location: [facility] Patient #: [id number] DOB: 16-Apr-1961 Undefined / Language: [name] / Race: Undefined Female  History of Present Illness Shelby Moores Rivera. Ronnie Mallette MD Patient words: Pt sent at Shelby request of Dr Sondra Come for left breast mammographic abnormality. 1 cm area of distortion bx showed IDC ER/PR POS HER 2 NEU NEGATIVE  Pt denies breast pain discharge or change to breast bilaterally              ADDITIONAL INFORMATION: FLUORESCENCE IN-SITU HYBRIDIZATION Results: HER2 - NEGATIVE RATIO OF HER2/CEP17 SIGNALS 1.15 AVERAGE HER2 COPY NUMBER PER CELL 1.90 Reference Range: NEGATIVE HER2/CEP17 Ratio <2.0 and average HER2 copy number <4.0 EQUIVOCAL HER2/CEP17 Ratio <2.0 and average HER2 copy number >=4.0 and <6.0 POSITIVE HER2/CEP17 Ratio >=2.0 or <2.0 and average HER2 copy number >=6.0 Shelby Laws MD Pathologist, Electronic Signature ( Signed 02/16/2017) PROGNOSTIC INDICATORS Results: IMMUNOHISTOCHEMICAL AND MORPHOMETRIC ANALYSIS PERFORMED MANUALLY Estrogen Receptor: 90%, POSITIVE, STRONG STAINING INTENSITY Progesterone Receptor: 0%, NEGATIVE Proliferation Marker Ki67: 2% COMMENT: Shelby negative hormone receptor study(ies) in this Rivera has an internal positive control. REFERENCE RANGE ESTROGEN RECEPTOR NEGATIVE 0% POSITIVE =>1% REFERENCE RANGE PROGESTERONE RECEPTOR NEGATIVE 0% 1 of 3 FINAL for [name] [phone]) ADDITIONAL INFORMATION:(continued) POSITIVE =>1% All controls stained appropriately Shelby Laws MD Pathologist, Electronic Signature ( Signed 02/12/2017) FINAL DIAGNOSIS Diagnosis Breast, left, needle core biopsy, upper inner - INVASIVE DUCTAL CARCINOMA. - SEE COMMENT. Microscopic Comment There is Rivera microscopic focus of grade I invasive ductal carcinoma as confirmed by lack of myoepithelial cells based on smooth muscle myosin, calponin, and p63 stains. Dr [name]  and concurs with this interpretation. Rivera full breast prognostic profile will be attempted and Shelby results reported separately. Shelby results were called to [name] on 02/11/17. (JBK:gt:ecj, 02/11/17) Shelby Cutter MD Pathologist, Electronic Signature (Rivera signed 02/11/2017) Specimen Gross and Clinical Information Specimen Comment TIF: 9:55 AM; extracted < 5 min; distortion Specimen(s) Obtained: Breast, left, needle core biopsy, upper inner Specimen Clinical Information Poss radial scar vs CSL vs IDC Gross Received labeled "Pain,[name]" and "[name]" (TIF 0955 CIT <12mn) are multiple cores and irregular pieces of yellow to gray white soft tissue, aggregating 3.2 x 2.7 x 0.3 cm. Four blocks submitted. (SSW 9/18) Stain(s) used in Diagnosis: Shelby following stain(s) were used in diagnosing [name]: P63, PR-ACIS, KI-67-ACIS, Her2 FISH, ER-ACIS, Smooth Muscle Myosin - 1 Heavy Chain, Calponin. Shelby control(s) stained appropriately. Disclaimer Ki-67 (MM1), immunohistochemical stains are performed on formalin fixed, paraffin embedded tissue using Rivera 3,3"-diaminobenzidine (DAB) chromogen and Leica Bond Autostainer System. Shelby staining intensity of Shelby nucleus is scored manually and is reported as Shelby percentage of tumor cell nuclei demonstrating specific nuclear staining.Specimens are fixed in 10% Neutral Buffered Formalin for at least 6 hours and up to 72 hours. These tests have not be validated on decalcified tissue. Results should be interpreted with caution given Shelby possibility of false negative results on 2 of 3 FINAL for FSHANDI, GODFREYA ([phone] Disclaimer(continued) decalcified specimens. Estrogen receptor (6F11), immunohistochemical stains are performed on formalin fixed, paraffin embedded tissue using Rivera 3,3"-diaminobenzidine (DAB) chromogen and Leica Bond Autostainer System. Shelby staining intensity of Shelby nucleus is scored manually and  is reported as Shelby percentage of tumor cell nuclei demonstrating specific nuclear staining.Specimens are fixed in 10% Neutral Buffered Formalin for at least 6 hours and up to 72 hours. These tests have not be validated on decalcified tissue. Results should be  interpreted with caution given Shelby possibility of false negative results on decalcified specimens. HER2 IQFISH pharmDX (code [id number]) is Rivera direct fluorescence in-situ hybridization assay designed to quantitatively determine HER2 gene amplification in formalin-fixed, paraffin-embedded tissue specimens. It is performed at [facility] and is reported using ASCO/CAP scoring criteria published in 2013. Some of these immunohistochemical stains may have been developed and Shelby performance characteristics determined by [facility]. Some may not have been cleared or approved by Shelby [place] Food and Drug Administration. Shelby FDA has determined that such clearance or approval is not necessary. This test is used for clinical purposes. It should not be regarded as investigational or for research. This laboratory is certified under Shelby [place] (CLIA-88) as qualified to perform high complexity clinical laboratory testing. PR progesterone receptor (16), immunohistochemical stains are performed on formalin fixed, paraffin embedded tissue using Rivera 3,3"-diaminobenzidine (DAB) chromogen and Leica Bond Autostainer System. Shelby staining intensity of Shelby nucleus is scored manually and is reported as Shelby percentage of tumor cell nuclei demonstrating specific nuclear staining.Specimens are fixed in 10% Neutral Buffered Formalin for at least 6 hours and up to 72 hours. These tests have not be validated on decalcified tissue. Results should be interpreted with caution given Shelby possibility of false negative results on decalcified specimens. Report signed out from Shelby following location(s) Technical Component was  performed at [facility]. [name].HYQM:[id number],[id number]., Technical Component was performed at [facility] [place], [place], [place] [address]. CLIA #: [id number], Interpretation was performed at [place], [place],  [address]. CLIA #: [id number], 3 of           CLINICAL DATA: Screening recall for Rivera possible architectural distortion in Shelby left breast.  EXAM: 2D DIGITAL DIAGNOSTIC LEFT MAMMOGRAM WITH CAD AND ADJUNCT TOMO  ULTRASOUND LEFT BREAST  COMPARISON: Previous exam(s).  ACR Breast Density Category c: Shelby breast tissue is heterogeneously dense, which may obscure small masses.  FINDINGS: Shelby possible architectural distortion, which lies in Shelby posterior upper left breast, persists on Shelby diagnostic spot-compression 3D CC images. It is not well seen on Shelby diagnostic 3D MLO images. This lies slightly medial to midline in Shelby superior left breast near Shelby 11 o'clock position. There is no discrete mass. There are no suspicious calcifications.  Mammographic images were processed with CAD.  Targeted ultrasound is performed, showing normal fibroglandular tissue in Shelby upper left breast. No mass, cyst or lesion. No sonographic architectural distortion. Sonographic evaluation of Shelby left axilla shows no enlarged or abnormal lymph nodes.  IMPRESSION: Small area of architectural distortion in Shelby posterior upper left breast at approximately 11 o'clock. Tissue sampling is recommended.  RECOMMENDATION: Stereotactic core needle biopsy of Shelby area of architectural distortion in Shelby left breast.  I have discussed Shelby findings and recommendations with Shelby patient. Results were also provided in writing at Shelby conclusion of Shelby visit. If applicable, Rivera reminder letter will be sent to Shelby patient regarding Shelby next  appointment.  BI-RADS CATEGORY 4: Suspicious.   Electronically Signed By: Lajean Manes M.D. On: 01/23/2017 14:55.  Shelby patient is Rivera 56 year old female.    Review of Systems (Tavarious Freel Rivera. Jaquasia Doscher MD General Not Present- Appetite Loss, Chills, Fatigue, Fever, Night Sweats, Weight Gain and Weight Loss. Note: All other systems negative (unless as noted in HPI & included Review of Systems) Skin Not Present- Change in Wart/Mole, Dryness, Hives, Jaundice, New  Lesions, Non-Healing Wounds, Rash and Ulcer. Respiratory Not Present- Bloody sputum, Chronic Cough, Difficulty Breathing, Snoring and Wheezing. Cardiovascular Not Present- Chest Pain, Difficulty Breathing Lying Down, Leg Cramps, Palpitations, Rapid Heart Rate, Shortness of Breath and Swelling of Extremities. Gastrointestinal Not Present- Abdominal Pain, Bloating, Bloody Stool, Change in Bowel Habits, Chronic diarrhea, Constipation, Difficulty Swallowing, Excessive gas, Gets full quickly at meals, Hemorrhoids, Indigestion, Nausea, Rectal Pain and Vomiting. Musculoskeletal Not Present- Back Pain, Joint Pain, Joint Stiffness, Muscle Pain, Muscle Weakness and Swelling of Extremities. Neurological Not Present- Decreased Memory, Fainting, Headaches, Numbness, Seizures, Tingling, Tremor, Trouble walking and Weakness. Endocrine Not Present- Cold Intolerance, Excessive Hunger, Hair Changes, Heat Intolerance, Hot flashes and New Diabetes. Hematology Not Present- Easy Bruising, Excessive bleeding, Gland problems, HIV and Persistent Infections.   Physical Exam (Samhita Kretsch Rivera. Raymel Cull MD  General Mental Status-Alert. General Appearance-Consistent with stated age. Hydration-Well hydrated. Voice-Normal.  Head and Neck Head-normocephalic, atraumatic with no lesions or palpable masses. Trachea-midline. Thyroid Gland Characteristics - normal size and consistency.  Eye Eyeball - Bilateral-Extraocular movements  intact. Sclera/Conjunctiva - Bilateral-No scleral icterus.  Breast Note: bruising left breast no mass  right breast normal  Cardiovascular Cardiovascular examination reveals -normal heart sounds, regular rate and rhythm with no murmurs and normal pedal pulses bilaterally.  Musculoskeletal Normal Exam - Left-Upper Extremity Strength Normal and Lower Extremity Strength Normal. Normal Exam - Right-Upper Extremity Strength Normal and Lower Extremity Strength Normal.  Lymphatic Head & Neck  General Head & Neck Lymphatics: Bilateral - Description - Normal. Axillary  General Axillary Region: Bilateral - Description - Normal. Tenderness - Non Tender.    Assessment & Plan (Jamaul Heist Rivera. Mahayla Haddaway MD  BREAST CANCER, LEFT (C50.912) Impression: Pt has opted for left breast partial mastectomy and SLN mapping Risk of lumpectomy include bleeding, infection, seroma, more surgery, use of seed/wire, wound care, cosmetic deformity and Shelby need for other treatments, death , blood clots, death. Pt agrees to proceed. Risk of sentinel lymph node mapping include bleeding, infection, lymphedema, shoulder pain. stiffness, dye allergy. cosmetic deformity , blood clots, death, need for more surgery. Pt agres to proceed.  Current Plans You are being scheduled for surgery- Our schedulers will call you.  You should hear from our office's scheduling department within 5 working days about Shelby location, date, and time of surgery. We try to make accommodations for patient's preferences in scheduling surgery, but sometimes Shelby OR schedule or Shelby surgeon's schedule prevents [place] from making those accommodations.  If you have not heard from our office [phone]) in 5 working days, call Shelby office and ask for your surgeon's nurse.  If you have other questions about your diagnosis, plan, or surgery, call Shelby office and ask for your surgeon's nurse.  Pt Education - [facility] Breast Cancer Information  Given - [facility] "Breast Journey" Package We discussed Shelby staging and pathophysiology of breast cancer. We discussed all of Shelby different options for treatment for breast cancer including surgery, chemotherapy, radiation therapy, Herceptin, and antiestrogen therapy. We discussed Rivera sentinel lymph node biopsy as she does not appear to having lymph node involvement right now. We discussed Shelby performance of that with injection of radioactive tracer and blue dye. We discussed that she would have an incision underneath her axillary hairline. We discussed that there is Rivera bout Rivera 10-20% chance of having Rivera positive node with Rivera sentinel lymph node biopsy and we will await Shelby permanent pathology to make any other first further decisions in terms of her treatment. One of these options  might be to return to Shelby operating room to perform an axillary lymph node dissection. We discussed about Rivera 1-2% risk lifetime of chronic shoulder pain as well as lymphedema associated with Rivera sentinel lymph node biopsy. We discussed Shelby options for treatment of Shelby breast cancer which included lumpectomy versus Rivera mastectomy. We discussed Shelby performance of Shelby lumpectomy with Rivera wire placement. We discussed Rivera 10-20% chance of Rivera positive margin requiring reexcision in Shelby operating room. We also discussed that she may need radiation therapy or antiestrogen therapy or both if she undergoes lumpectomy. We discussed Shelby mastectomy and Shelby postoperative care for that as well. We discussed that there is no difference in her survival whether she undergoes lumpectomy with radiation therapy or antiestrogen therapy versus Rivera mastectomy. There is Rivera slight difference in Shelby local recurrence rate being 3-5% with lumpectomy and about 1% with Rivera mastectomy. We discussed Shelby risks of operation including bleeding, infection, possible reoperation. She understands her further therapy will be based on what her stages at Shelby time of her operation.  Pt Education -  flb breast cancer surgery: discussed with patient and provided information. Pt Education - ABC (After Breast Cancer) Class Info: discussed with patient and provided information.

## 2017-03-24 NOTE — Anesthesia Procedure Notes (Signed)
Anesthesia Regional Block: Pectoralis block   Pre-Anesthetic Checklist: ,, timeout performed, Correct Patient, Correct Site, Correct Laterality, Correct Procedure,, site marked, risks and benefits discussed, Surgical consent,  Pre-op evaluation,  At surgeon's request and post-op pain management  Laterality: Left  Prep: chloraprep       Needles:  Injection technique: Single-shot  Needle Type: Echogenic Stimulator Needle     Needle Length: 9cm  Needle Gauge: 21     Additional Needles:   Procedures:,,,, ultrasound used (permanent image in chart),,,,  Narrative:  Start time: 03/24/2017 7:10 AM End time: 03/24/2017 7:20 AM Injection made incrementally with aspirations every 5 mL.  Performed by: Personally  Anesthesiologist: Adele Barthel P  Additional Notes: Functioning IV was confirmed and monitors were applied.  A 75mm 21ga Arrow echogenic stimulator needle was used. Sterile prep, hand hygiene and sterile gloves were used.  Negative aspiration and negative test dose prior to incremental administration of local anesthetic. The patient tolerated the procedure well.

## 2017-03-24 NOTE — Op Note (Signed)
Preoperative diagnosis: Stage I left breast cancer upper inner quadrant  Postoperative diagnosis: Same  Procedure: Left breast seed localized partial mastectomy with left axillary sentinel lymph node mapping deep left axillary level 2  Surgeon: Erroll Luna MD  Anesthesia: LMA with local and pectoral block per anesthesia  EBL:  50 cc   Specimen: Left breast mass with seeding clip in specimen shave margins of all margins and 4 left axillary sentinel nodes hot  Drains: None  Indications for procedure: The patient presents for breast conserving surgery after being diagnosed with stage I left breast cancer.  Options were discussed and she opted for lumpectomy with sentinel node.  Risks, benefits and other types of surgeries and treatments were discussed with the patient. The procedure has been discussed with the patient. Alternatives to surgery have been discussed with the patient.  Risks of surgery include bleeding,  Infection,  Seroma formation, death,  and the need for further surgery.   The patient understands and wishes to proceed.Sentinel lymph node mapping and dissection has been discussed with the patient.  Risk of bleeding,  Infection,  Seroma formation,  Additional procedures,,  Shoulder weakness , arm swelling  Shoulder stiffness,  Nerve and blood vessel injury and reaction to the mapping dyes have been discussed.  Alternatives to surgery have been discussed with the patient.  The patient agrees to proceed.  Description of procedure: The patient was met in the holding area and questions were answered.  She underwent a block per anesthesia.  She underwent injection by nuclear medicine for the mapping part of the procedure.  The left breast was marked as the correct side.  She was taken back to the operating room and placed supine on the operating room table.  After induction of LMA anesthesia, the left breast was prepped and draped in sterile fashion.  Timeout was done.  She received  preoperative antibiotics.  The neoprobe was used to identify the tumor in the left breast upper inner quadrant.  A curvilinear incision was made along the superior border of the nipple areolar complex and with the help of the neoprobe dissection was carried down to the seating clip.  All tissue was excised with gross negative margins.  Specimen radiograph revealed the seed and clip to be at what appeared to be the posterior superior medial margin.  I opted to shave all margins which I did.  These were oriented and inked.  These were all sent to pathology.  The cavity was made hemostatic.  Clips were placed to mark the cavity and the cavity was closed with 3-0 Vicryl and 4-0 Monocryl.  The neoprobe settings were switched for sentinel lymph node mapping.  Hot nodes identified and a mark was placed in the skin.  A 4 cm incision was made in the left axilla.  Dissection was carried down into the deep level 2 axillary lymph node basin.  There were 4 hot sentinel nodes that were excised.  Background counts approaches 0.  Hemostasis achieved with cautery and clips.  The long thoracic nerve, the thoracodorsal trunk and axillary vein were all preserved.  Irrigation was used.  The cavity was then closed with 3-0 Vicryl and 4-0 Monocryl.  Dermabond applied to both incisions.  All final counts found to be correct.  Binder placed.  The patient was awoke extubated taken to recovery in satisfactory condition.

## 2017-03-24 NOTE — Progress Notes (Signed)
Assisted Dr. Ellender with left, ultrasound guided, pectoralis block. Side rails up, monitors on throughout procedure. See vital signs in flow sheet. Tolerated Procedure well. 

## 2017-03-24 NOTE — Anesthesia Postprocedure Evaluation (Signed)
Anesthesia Post Note  Patient: [name]  Procedure(s) Performed: BREAST PARTIAL MASTECTOMY WITH RADIOACTIVE SEED AND SENTINEL LYMPH NODE BIOPSY (Left Breast)     Patient location during evaluation: PACU Anesthesia Type: Regional and General Level of consciousness: awake and alert Pain management: pain level controlled Vital Signs Assessment: post-procedure vital signs reviewed and stable Respiratory status: spontaneous breathing, nonlabored ventilation, respiratory function stable and patient connected to nasal cannula oxygen Cardiovascular status: blood pressure returned to baseline and stable Postop Assessment: no apparent nausea or vomiting Anesthetic complications: no    Last Vitals:  Vitals:   03/24/17 0924 03/24/17 0945  BP:  (!) 148/79  Pulse: 85 78  Resp: 18 16  Temp:  36.6 C  SpO2: 99% 96%    Last Pain:  Vitals:   03/24/17 0856  TempSrc:   PainSc: 0-No pain                 Zarek Relph P Jaelynne Hockley

## 2017-03-25 ENCOUNTER — Encounter (HOSPITAL_BASED_OUTPATIENT_CLINIC_OR_DEPARTMENT_OTHER): Payer: Self-pay | Admitting: Surgery

## 2017-03-27 ENCOUNTER — Telehealth: Payer: Self-pay | Admitting: *Deleted

## 2017-03-27 ENCOUNTER — Encounter: Payer: Self-pay | Admitting: Radiation Oncology

## 2017-03-27 ENCOUNTER — Other Ambulatory Visit: Payer: Self-pay | Admitting: *Deleted

## 2017-03-27 DIAGNOSIS — Z170 Estrogen receptor positive status [ER+]: Principal | ICD-10-CM

## 2017-03-27 DIAGNOSIS — C50212 Malignant neoplasm of upper-inner quadrant of left female breast: Secondary | ICD-10-CM

## 2017-03-27 NOTE — Telephone Encounter (Signed)
Called pt and informed she does not need oncotype per Dr. Burr Medico and her next step is radiation. Received verbal understanding. Denies questions or needs at this time

## 2017-04-10 NOTE — Progress Notes (Signed)
Location of Breast Cancer: Stage 1 invasive ductal carcinoma in the upper inner quadrant of the left breast  Histology per Pathology Report:   03/24/17 Diagnosis 1. Breast, lumpectomy, Left - INVASIVE DUCTAL CARCINOMA, GRADE 1, SPANNING 0.5 CM. - FINAL RESECTION MARGINS (PARTS 2-7) ARE NEGATIVE. - BIOPSY SITE. - SEE ONCOLOGY TABLE. 2. Breast, excision, Left additional Posterior Margin - BENIGN BREAST TISSUE. 3. Breast, excision, Left additional Anterior Margin - BENIGN BREAST TISSUE. 4. Breast, excision, Left Additional Superior Margin - BENIGN BREAST TISSUE. 5. Breast, excision, Left additional Inferior Margin - BENIGN BREAST TISSUE. 6. Breast, excision, Left additional Medial Margin - BENIGN BREAST TISSUE. 7. Breast, excision, Left additional Lateral Margin - BENIGN BREAST TISSUE. 8. Lymph node, sentinel, biopsy, Left Axillary - ONE OF ONE LYMPH NODES NEGATIVE FOR CARCINOMA (0/1). 9. Lymph node, sentinel, biopsy, Left - ONE OF ONE LYMPH NODES NEGATIVE FOR CARCINOMA (0/1). 10. Lymph node, sentinel, biopsy, Left - ONE OF ONE LYMPH NODES NEGATIVE FOR CARCINOMA (0/1). 11. Lymph node, sentinel, biopsy, Left - ONE OF ONE LYMPH NODES NEGATIVE FOR CARCINOMA (0/1). 12. Lymph node, sentinel, biopsy, Left - ONE OF ONE LYMPH NODES NEGATIVE FOR CARCINOMA (0/1).  02/10/17 Diagnosis Breast, left, needle core biopsy, upper inner - INVASIVE DUCTAL CARCINOMA. - SEE COMMENT. Microscopic Comment There is a microscopic focus of grade I invasive ductal carcinoma as confirmed by lack of myoepithelial cells based on smooth muscle myosin, calponin, and p63 stains. Dr John Patrick has reviewed the case and concurs with this interpretation. A full breast prognostic profile will be attempted and the results reported separately. The results were called to [facility] on 02/11/17. (JBK:gt:ecj, 02/11/17)  Receptor Status: ER(90%), PR (0%), Her2-neu (neg), Ki-(2%)  Did patient  present with symptoms (if so, please note symptoms) or was this found on screening mammography?: screening mammogram  Past/Anticipated interventions by surgeon, if any: 03/24/17 - Procedure: BREAST PARTIAL MASTECTOMY WITH RADIOACTIVE SEED AND SENTINEL LYMPH NODE BIOPSY;  Surgeon: Cornett, Thomas, MD  Past/Anticipated interventions by medical oncology, if any: adjuvant endocrine therapy with aromatase inhibitor for a total of 5-10 years   Lymphedema issues, if any:  no    Pain issues, if any:  Has numbness/tenderness in left underarm and upper arm area from surgery  SAFETY ISSUES:  Prior radiation? no  Pacemaker/ICD? no  Possible current pregnancy?no  Is the patient on methotrexate? no  Current Complaints / other details:    BP 115/75 (BP Location: Right Arm, Patient Position: Sitting)   Pulse 68   Temp 98.9 F (37.2 C) (Oral)   Ht 5' 4.5" (1.638 m)   Wt 203 lb 3.2 oz (92.2 kg)   SpO2 100%   BMI 34.34 kg/m     Hess, Karen R, RN 04/10/2017,8:54 AM   

## 2017-04-13 ENCOUNTER — Ambulatory Visit
Admission: RE | Admit: 2017-04-13 | Discharge: 2017-04-13 | Disposition: A | Discharge status: Home / Self Care | Payer: BLUE CROSS/BLUE SHIELD | Source: Ambulatory Visit | Attending: Radiation Oncology | Admitting: Radiation Oncology

## 2017-04-13 ENCOUNTER — Other Ambulatory Visit: Payer: Self-pay

## 2017-04-13 ENCOUNTER — Encounter: Payer: Self-pay | Admitting: Radiation Oncology

## 2017-04-13 DIAGNOSIS — Z170 Estrogen receptor positive status [ER+]: Secondary | ICD-10-CM | POA: Diagnosis not present

## 2017-04-13 DIAGNOSIS — C50212 Malignant neoplasm of upper-inner quadrant of left female breast: Secondary | ICD-10-CM

## 2017-04-13 DIAGNOSIS — Z510 Encounter for antineoplastic radiation therapy: Secondary | ICD-10-CM | POA: Diagnosis not present

## 2017-04-13 NOTE — Progress Notes (Signed)
Radiation Oncology         [phone] ________________________________  Name: [name] MRN: [id number]  Date: 04/13/2017  DOB: May 29, 1960  Re-consultation Outpatient   CC: [name], MD  Truitt Merle, MD    ICD-10-CM   1. 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 Of note since the patient last visit, patient has had a left breast lumpectomy with additional margins and left axillary sentinel lymph node biopsies completed on 03/24/17 with results revealing invasive ductal carcinoma spanning 0.5 cm with negative margins. She reports that she is doing well overall.  On review of systems, she reports some mild soreness of the right axilla where her surgery was done. She denies lymphedema. She denies any other symptoms or complaints.                                ALLERGIES:  is allergic to latex.  Meds: Current Outpatient Medications  Medication Sig Dispense Refill  . ibuprofen (ADVIL,MOTRIN) 800 MG tablet Take 1 tablet (800 mg total) by mouth every 8 (eight) hours as needed. (Patient not taking: Reported on 04/13/2017) 30 tablet 0   No current facility-administered medications for this encounter.     Physical Findings: The patient is in no acute distress. Patient is alert and oriented.  height is 5' 4.5" (1.638 m) and weight is 203 lb 3.2 oz (92.2 kg). Her oral temperature is 98.9 F (37.2 C). Her blood pressure is 115/75 and her pulse is 68. Her oxygen saturation is 100%. .   Lungs are clear to auscultation bilaterally. Heart has regular rate and rhythm. No palpable cervical, supraclavicular, or axillary adenopathy. Abdomen soft, non-tender, normal bowel sounds. Left  breast with a scar at 11 o'clock position that is healing well without signs of drainage or infection. Patient also has a separate scar at the axillary region from her sentinel node procedure. No signs of infection or drainage of the breast area.   Lab Findings: Lab Results  Component Value Date   WBC 6.0 02/18/2017   HGB 13.4 02/18/2017   HCT 40.1 02/18/2017   MCV 92.2 02/18/2017   PLT 163 02/18/2017    Radiographic Findings: Nm Sentinel Node Inj-no Rpt (breast)  Result Date: 03/24/2017 Sulfur colloid was injected by the nuclear medicine technologist for melanoma sentinel node.   Mm Breast Surgical Specimen  Result Date: 03/24/2017 CLINICAL DATA:  Biopsy-proven grade 1 invasive ductal carcinoma involving the upper inner quadrant of the left breast. Radioactive seed localization was performed yesterday. Lumpectomy is performed today and the specimen is submitted for evaluation. EXAM: SPECIMEN RADIOGRAPH OF THE LEFT BREAST COMPARISON:  Previous exam(s). FINDINGS: Status post excision of the left breast. The radioactive seed and the coil shaped biopsy marker clip are present, completely intact, and are marked for pathology. This was discussed directly with the nurse in the operating room at the time of interpretation 03/24/2017 at 8:20 a.m. IMPRESSION: Specimen radiograph of the left breast. Electronically Signed   By: Evangeline Dakin M.D.   On: 03/24/2017 08:22   Mm Lt Radioactive Seed Loc Mammo Guide  Result Date: 03/23/2017  CLINICAL DATA:  Preoperative radioactive seed localization for grade 1 invasive ductal carcinoma of the left breast. EXAM: MAMMOGRAPHIC GUIDED RADIOACTIVE SEED LOCALIZATION OF THE LEFT BREAST COMPARISON:  Previous exam(s). FINDINGS: Patient presents for radioactive seed localization prior to left breast lumpectomy. I met with the patient and we discussed the procedure of seed localization including benefits and alternatives. We discussed the high likelihood of a  successful procedure. We discussed the risks of the procedure including infection, bleeding, tissue injury and further surgery. We discussed the low dose of radioactivity involved in the procedure. Informed, written consent was given. The usual time-out protocol was performed immediately prior to the procedure. Using mammographic guidance, sterile technique, 1% lidocaine and an I-125 radioactive seed, left breast distortion containing a coil shaped marker was localized using a superior approach. The follow-up mammogram images confirm the seed in the expected location and were marked for Dr. Brantley Stage. The radioactive seed is located 6 mm superior and anterior to the post biopsy marker. Follow-up survey of the patient confirms presence of the radioactive seed. Order number of I-125 seed:  [id number]. Total activity: 5.051 millicurie Reference Date: February 13, 2017 The patient tolerated the procedure well and was released from the [facility]. She was given instructions regarding seed removal. IMPRESSION: Radioactive seed localization left breast. No apparent complications. Electronically Signed   By: Fidela Salisbury M.D.   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 The risks vs benefits, side effects, and potential toxicities were discussed at great length with the patient. I informed them that they may experience possible fatigue and radiation-related skin changes within their treatment field. The patient appears to understand and wishes to proceed with planned course of treatment. A consent form was signed and placed in patient's chart.   Plan: CT simulation will be done on 04/23/17 with treatments to begin on 04/30/17 approximately 5.5 weeks after her surgery. The patient would  appear to be a good candidate for hypo-fractionated radiation therapy.    This document serves as a record of services personally performed by Gery Pray, MD. It was created on her behalf by Marlowe Kays, a trained medical scribe. The creation of this record is based on the scribe's personal observations and the provider's statements to them. This document has been checked and approved by the attending provider.

## 2017-04-23 ENCOUNTER — Ambulatory Visit
Admission: RE | Admit: 2017-04-23 | Discharge: 2017-04-23 | Disposition: A | Discharge status: Home / Self Care | Payer: BLUE CROSS/BLUE SHIELD | Source: Ambulatory Visit | Attending: Radiation Oncology | Admitting: Radiation Oncology

## 2017-04-23 DIAGNOSIS — Z510 Encounter for antineoplastic radiation therapy: Secondary | ICD-10-CM | POA: Diagnosis not present

## 2017-04-23 DIAGNOSIS — C50212 Malignant neoplasm of upper-inner quadrant of left female breast: Secondary | ICD-10-CM

## 2017-04-23 DIAGNOSIS — Z170 Estrogen receptor positive status [ER+]: Principal | ICD-10-CM

## 2017-04-26 NOTE — Progress Notes (Addendum)
  Radiation Oncology         [phone] ________________________________  Name: [name] MRN: [id number]  Date: 04/23/2017  DOB: [birth year]  SIMULATION AND TREATMENT PLANNING NOTE    ICD-10-CM   1. 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  Identity was confirmed.  All relevant records and images related to the planned course of therapy were reviewed.  The patient freely provided informed written consent to proceed with treatment after reviewing the details related to the planned course of therapy. The consent form was witnessed and verified by the simulation staff.  Then, the patient was set-up in a stable reproducible  supine position for radiation therapy.  CT images were obtained.  Surface markings were placed.  The CT images were loaded into the planning software.  Then the target and avoidance structures were contoured.  Treatment planning then occurred.  The radiation prescription was entered and confirmed.  Then, I designed and supervised the construction of a total of 3 medically necessary complex treatment devices.  I have requested : 3D Simulation  I have requested a DVH of the following structures: heart, lungs, lumpectomy cavity.  I have ordered:dose calc.  PLAN:  The patient will receive 40.05 Gy in 15 fractions, followed by a boost to the lumpectomy cavity of 10 Gy in 5 fractions    -----------------------------------  Blair Promise, PhD, MD

## 2017-04-27 DIAGNOSIS — Z510 Encounter for antineoplastic radiation therapy: Secondary | ICD-10-CM | POA: Diagnosis not present

## 2017-04-29 ENCOUNTER — Telehealth: Payer: Self-pay | Admitting: Hematology

## 2017-04-29 NOTE — Telephone Encounter (Signed)
Scheduled appt per 12/3 sch message - Patient is aware of  appt date and time. - Reminder letter sent in the mail.

## 2017-04-30 ENCOUNTER — Ambulatory Visit
Admission: RE | Admit: 2017-04-30 | Discharge: 2017-04-30 | Disposition: A | Discharge status: Home / Self Care | Payer: BLUE CROSS/BLUE SHIELD | Source: Ambulatory Visit | Attending: Radiation Oncology | Admitting: Radiation Oncology

## 2017-04-30 DIAGNOSIS — Z510 Encounter for antineoplastic radiation therapy: Secondary | ICD-10-CM | POA: Diagnosis not present

## 2017-04-30 DIAGNOSIS — C50212 Malignant neoplasm of upper-inner quadrant of left female breast: Secondary | ICD-10-CM

## 2017-04-30 DIAGNOSIS — Z170 Estrogen receptor positive status [ER+]: Principal | ICD-10-CM

## 2017-04-30 NOTE — Progress Notes (Signed)
  Radiation Oncology         [phone] ________________________________  Name: [name] MRN: [id number]  Date: 04/30/2017  DOB: 1961/01/29  Simulation Verification Note    ICD-10-CM   1. Malignant neoplasm of upper-inner quadrant of left breast in female, estrogen receptor positive ([place]) C50.212    Z17.0     Status: outpatient  NARRATIVE: The patient was brought to the treatment unit and placed in the planned treatment position. The clinical setup was verified. Then port films were obtained and uploaded to the radiation oncology medical record software.  The treatment beams were carefully compared against the planned radiation fields. The position location and shape of the radiation fields was reviewed. They targeted volume of tissue appears to be appropriately covered by the radiation beams. Organs at risk appear to be excluded as planned.  Based on my personal review, I approved the simulation verification. The patient's treatment will proceed as planned.  -----------------------------------  Blair Promise, PhD, MD

## 2017-05-04 ENCOUNTER — Ambulatory Visit: Payer: BLUE CROSS/BLUE SHIELD

## 2017-05-05 ENCOUNTER — Ambulatory Visit: Payer: BLUE CROSS/BLUE SHIELD

## 2017-05-06 ENCOUNTER — Ambulatory Visit
Admission: RE | Admit: 2017-05-06 | Discharge: 2017-05-06 | Disposition: A | Discharge status: Home / Self Care | Payer: BLUE CROSS/BLUE SHIELD | Source: Ambulatory Visit | Attending: Radiation Oncology | Admitting: Radiation Oncology

## 2017-05-06 DIAGNOSIS — Z510 Encounter for antineoplastic radiation therapy: Secondary | ICD-10-CM | POA: Diagnosis not present

## 2017-05-07 ENCOUNTER — Ambulatory Visit
Admission: RE | Admit: 2017-05-07 | Discharge: 2017-05-07 | Disposition: A | Discharge status: Home / Self Care | Payer: BLUE CROSS/BLUE SHIELD | Source: Ambulatory Visit | Attending: Radiation Oncology | Admitting: Radiation Oncology

## 2017-05-07 DIAGNOSIS — Z510 Encounter for antineoplastic radiation therapy: Secondary | ICD-10-CM | POA: Diagnosis not present

## 2017-05-08 ENCOUNTER — Ambulatory Visit
Admission: RE | Admit: 2017-05-08 | Discharge: 2017-05-08 | Disposition: A | Discharge status: Home / Self Care | Payer: BLUE CROSS/BLUE SHIELD | Source: Ambulatory Visit | Attending: Radiation Oncology | Admitting: Radiation Oncology

## 2017-05-08 DIAGNOSIS — Z510 Encounter for antineoplastic radiation therapy: Secondary | ICD-10-CM | POA: Diagnosis not present

## 2017-05-09 ENCOUNTER — Ambulatory Visit
Admission: RE | Admit: 2017-05-09 | Discharge: 2017-05-09 | Disposition: A | Discharge status: Home / Self Care | Payer: BLUE CROSS/BLUE SHIELD | Source: Ambulatory Visit | Attending: Radiation Oncology | Admitting: Radiation Oncology

## 2017-05-09 DIAGNOSIS — Z510 Encounter for antineoplastic radiation therapy: Secondary | ICD-10-CM | POA: Diagnosis not present

## 2017-05-11 ENCOUNTER — Ambulatory Visit
Admission: RE | Admit: 2017-05-11 | Discharge: 2017-05-11 | Disposition: A | Discharge status: Home / Self Care | Payer: BLUE CROSS/BLUE SHIELD | Source: Ambulatory Visit | Attending: Radiation Oncology | Admitting: Radiation Oncology

## 2017-05-11 DIAGNOSIS — Z170 Estrogen receptor positive status [ER+]: Principal | ICD-10-CM

## 2017-05-11 DIAGNOSIS — C50212 Malignant neoplasm of upper-inner quadrant of left female breast: Secondary | ICD-10-CM

## 2017-05-11 DIAGNOSIS — Z510 Encounter for antineoplastic radiation therapy: Secondary | ICD-10-CM | POA: Diagnosis not present

## 2017-05-11 MED ORDER — ALRA NON-METALLIC DEODORANT (RAD-ONC)
1.0000 "application " | Freq: Once | TOPICAL | Status: AC
  Administered 2017-05-11: 1 via TOPICAL

## 2017-05-11 MED ORDER — RADIAPLEXRX EX GEL
Freq: Once | CUTANEOUS | Status: AC
  Administered 2017-05-11: 17:00:00 via TOPICAL

## 2017-05-11 NOTE — Progress Notes (Signed)

## 2017-05-12 ENCOUNTER — Ambulatory Visit
Admission: RE | Admit: 2017-05-12 | Discharge: 2017-05-12 | Disposition: A | Discharge status: Home / Self Care | Payer: BLUE CROSS/BLUE SHIELD | Source: Ambulatory Visit | Attending: Radiation Oncology | Admitting: Radiation Oncology

## 2017-05-12 DIAGNOSIS — Z510 Encounter for antineoplastic radiation therapy: Secondary | ICD-10-CM | POA: Diagnosis not present

## 2017-05-13 ENCOUNTER — Ambulatory Visit
Admission: RE | Admit: 2017-05-13 | Discharge: 2017-05-13 | Disposition: A | Discharge status: Home / Self Care | Payer: BLUE CROSS/BLUE SHIELD | Source: Ambulatory Visit | Attending: Radiation Oncology | Admitting: Radiation Oncology

## 2017-05-13 DIAGNOSIS — Z510 Encounter for antineoplastic radiation therapy: Secondary | ICD-10-CM | POA: Diagnosis not present

## 2017-05-14 ENCOUNTER — Ambulatory Visit
Admission: RE | Admit: 2017-05-14 | Discharge: 2017-05-14 | Disposition: A | Discharge status: Home / Self Care | Payer: BLUE CROSS/BLUE SHIELD | Source: Ambulatory Visit | Attending: Radiation Oncology | Admitting: Radiation Oncology

## 2017-05-14 DIAGNOSIS — Z510 Encounter for antineoplastic radiation therapy: Secondary | ICD-10-CM | POA: Diagnosis not present

## 2017-05-15 ENCOUNTER — Ambulatory Visit
Admission: RE | Admit: 2017-05-15 | Discharge: 2017-05-15 | Disposition: A | Discharge status: Home / Self Care | Payer: BLUE CROSS/BLUE SHIELD | Source: Ambulatory Visit | Attending: Radiation Oncology | Admitting: Radiation Oncology

## 2017-05-15 DIAGNOSIS — Z510 Encounter for antineoplastic radiation therapy: Secondary | ICD-10-CM | POA: Diagnosis not present

## 2017-05-18 ENCOUNTER — Ambulatory Visit: Payer: BLUE CROSS/BLUE SHIELD

## 2017-05-20 ENCOUNTER — Ambulatory Visit
Admission: RE | Admit: 2017-05-20 | Discharge: 2017-05-20 | Disposition: A | Discharge status: Home / Self Care | Payer: BLUE CROSS/BLUE SHIELD | Source: Ambulatory Visit | Attending: Radiation Oncology | Admitting: Radiation Oncology

## 2017-05-20 DIAGNOSIS — Z170 Estrogen receptor positive status [ER+]: Secondary | ICD-10-CM | POA: Diagnosis not present

## 2017-05-20 DIAGNOSIS — C50212 Malignant neoplasm of upper-inner quadrant of left female breast: Secondary | ICD-10-CM | POA: Insufficient documentation

## 2017-05-20 DIAGNOSIS — Z510 Encounter for antineoplastic radiation therapy: Secondary | ICD-10-CM | POA: Insufficient documentation

## 2017-05-20 MED ORDER — SONAFINE EX EMUL
1.0000 "application " | Freq: Two times a day (BID) | CUTANEOUS | Status: DC
  Administered 2017-05-20: 1 via TOPICAL

## 2017-05-21 ENCOUNTER — Ambulatory Visit
Admission: RE | Admit: 2017-05-21 | Discharge: 2017-05-21 | Disposition: A | Discharge status: Home / Self Care | Payer: BLUE CROSS/BLUE SHIELD | Source: Ambulatory Visit | Attending: Radiation Oncology | Admitting: Radiation Oncology

## 2017-05-21 DIAGNOSIS — Z510 Encounter for antineoplastic radiation therapy: Secondary | ICD-10-CM | POA: Diagnosis not present

## 2017-05-22 ENCOUNTER — Ambulatory Visit
Admission: RE | Admit: 2017-05-22 | Discharge: 2017-05-22 | Disposition: A | Discharge status: Home / Self Care | Payer: BLUE CROSS/BLUE SHIELD | Source: Ambulatory Visit | Attending: Radiation Oncology | Admitting: Radiation Oncology

## 2017-05-22 DIAGNOSIS — Z510 Encounter for antineoplastic radiation therapy: Secondary | ICD-10-CM | POA: Diagnosis not present

## 2017-05-25 ENCOUNTER — Ambulatory Visit: Payer: BLUE CROSS/BLUE SHIELD | Admitting: Radiation Oncology

## 2017-05-25 ENCOUNTER — Ambulatory Visit
Admission: RE | Admit: 2017-05-25 | Discharge: 2017-05-25 | Disposition: A | Discharge status: Home / Self Care | Payer: BLUE CROSS/BLUE SHIELD | Source: Ambulatory Visit | Attending: Radiation Oncology | Admitting: Radiation Oncology

## 2017-05-25 DIAGNOSIS — Z510 Encounter for antineoplastic radiation therapy: Secondary | ICD-10-CM | POA: Diagnosis not present

## 2017-05-27 ENCOUNTER — Ambulatory Visit: Payer: BLUE CROSS/BLUE SHIELD

## 2017-05-27 ENCOUNTER — Ambulatory Visit
Admission: RE | Admit: 2017-05-27 | Discharge: 2017-05-27 | Disposition: A | Discharge status: Home / Self Care | Payer: BLUE CROSS/BLUE SHIELD | Source: Ambulatory Visit | Attending: Radiation Oncology | Admitting: Radiation Oncology

## 2017-05-27 DIAGNOSIS — Z510 Encounter for antineoplastic radiation therapy: Secondary | ICD-10-CM | POA: Diagnosis not present

## 2017-05-28 ENCOUNTER — Ambulatory Visit: Payer: BLUE CROSS/BLUE SHIELD

## 2017-05-28 ENCOUNTER — Ambulatory Visit
Admission: RE | Admit: 2017-05-28 | Discharge: 2017-05-28 | Disposition: A | Discharge status: Home / Self Care | Payer: BLUE CROSS/BLUE SHIELD | Source: Ambulatory Visit | Attending: Radiation Oncology | Admitting: Radiation Oncology

## 2017-05-28 DIAGNOSIS — Z510 Encounter for antineoplastic radiation therapy: Secondary | ICD-10-CM | POA: Diagnosis not present

## 2017-05-29 ENCOUNTER — Ambulatory Visit: Payer: BLUE CROSS/BLUE SHIELD

## 2017-05-29 ENCOUNTER — Ambulatory Visit
Admission: RE | Admit: 2017-05-29 | Discharge: 2017-05-29 | Disposition: A | Discharge status: Home / Self Care | Payer: BLUE CROSS/BLUE SHIELD | Source: Ambulatory Visit | Attending: Radiation Oncology | Admitting: Radiation Oncology

## 2017-05-29 DIAGNOSIS — Z510 Encounter for antineoplastic radiation therapy: Secondary | ICD-10-CM | POA: Diagnosis not present

## 2017-06-01 ENCOUNTER — Ambulatory Visit
Admission: RE | Admit: 2017-06-01 | Discharge: 2017-06-01 | Disposition: A | Discharge status: Home / Self Care | Payer: BLUE CROSS/BLUE SHIELD | Source: Ambulatory Visit | Attending: Radiation Oncology | Admitting: Radiation Oncology

## 2017-06-01 DIAGNOSIS — Z510 Encounter for antineoplastic radiation therapy: Secondary | ICD-10-CM | POA: Diagnosis not present

## 2017-06-02 ENCOUNTER — Ambulatory Visit
Admission: RE | Admit: 2017-06-02 | Discharge: 2017-06-02 | Disposition: A | Discharge status: Home / Self Care | Payer: BLUE CROSS/BLUE SHIELD | Source: Ambulatory Visit | Attending: Radiation Oncology | Admitting: Radiation Oncology

## 2017-06-02 ENCOUNTER — Ambulatory Visit: Payer: BLUE CROSS/BLUE SHIELD

## 2017-06-02 DIAGNOSIS — Z170 Estrogen receptor positive status [ER+]: Principal | ICD-10-CM

## 2017-06-02 DIAGNOSIS — Z510 Encounter for antineoplastic radiation therapy: Secondary | ICD-10-CM | POA: Diagnosis not present

## 2017-06-02 DIAGNOSIS — C50212 Malignant neoplasm of upper-inner quadrant of left female breast: Secondary | ICD-10-CM

## 2017-06-02 MED ORDER — SILVER SULFADIAZINE 1 % EX CREA
TOPICAL_CREAM | Freq: Once | CUTANEOUS | Status: AC
  Administered 2017-06-02: 10:00:00 via TOPICAL

## 2017-06-03 ENCOUNTER — Ambulatory Visit
Admission: RE | Admit: 2017-06-03 | Discharge: 2017-06-03 | Disposition: A | Discharge status: Home / Self Care | Payer: BLUE CROSS/BLUE SHIELD | Source: Ambulatory Visit | Attending: Radiation Oncology | Admitting: Radiation Oncology

## 2017-06-03 ENCOUNTER — Ambulatory Visit: Payer: BLUE CROSS/BLUE SHIELD

## 2017-06-03 ENCOUNTER — Inpatient Hospital Stay: Payer: BLUE CROSS/BLUE SHIELD | Attending: Hematology | Admitting: Hematology

## 2017-06-03 VITALS — BP 134/77 | HR 65 | Temp 98.2°F | Resp 20 | Ht 64.5 in | Wt 202.4 lb

## 2017-06-03 DIAGNOSIS — Z79811 Long term (current) use of aromatase inhibitors: Secondary | ICD-10-CM | POA: Diagnosis not present

## 2017-06-03 DIAGNOSIS — C50212 Malignant neoplasm of upper-inner quadrant of left female breast: Secondary | ICD-10-CM | POA: Diagnosis not present

## 2017-06-03 DIAGNOSIS — C50512 Malignant neoplasm of lower-outer quadrant of left female breast: Secondary | ICD-10-CM | POA: Diagnosis present

## 2017-06-03 DIAGNOSIS — Z170 Estrogen receptor positive status [ER+]: Secondary | ICD-10-CM | POA: Diagnosis not present

## 2017-06-03 DIAGNOSIS — Z510 Encounter for antineoplastic radiation therapy: Secondary | ICD-10-CM | POA: Diagnosis not present

## 2017-06-03 DIAGNOSIS — E2839 Other primary ovarian failure: Secondary | ICD-10-CM

## 2017-06-03 MED ORDER — ANASTROZOLE 1 MG PO TABS: 1.0000 mg | ORAL_TABLET | Freq: Every day | ORAL | 2 refills | Status: DC

## 2017-06-03 NOTE — Progress Notes (Signed)
[facility]  [phone] Fax:([phone]) [phone]  Clinic Follow Up Note   Patient Care Team: [name], MD as PCP - General (Family Medicine) Erroll Luna, MD as Consulting Physician (General Surgery) Truitt Merle, MD as Consulting Physician (Hematology) Gery Pray, MD as Consulting Physician (Radiation Oncology) 06/03/2017  CHIEF COMPLAINTS:  Left Breast Cancer    Oncology History   Cancer Staging Malignant neoplasm of upper-inner quadrant of left breast in female, estrogen receptor positive ([place]) Staging form: Breast, AJCC 8th Edition - Clinical stage from 02/10/2017: Stage Unknown (cTX, cN0, cM0, G1, ER: Positive, PR: Negative, HER2: Negative) - Signed by Truitt Merle, MD on 02/17/2017       Malignant neoplasm of upper-inner quadrant of left breast in female, estrogen receptor positive ([place])   01/23/2017 Mammogram    DIAGNOSTIC MAMMOGRAM AND US IMPRESSION: Small area of architectural distortion in the posterior upper left breast at approximately 11 o'clock. Tissue sampling is recommended. [place] of left breast and axilla was negative       02/10/2017 Receptors her2    Estrogen Receptor: 90%, POSITIVE, STRONG STAINING INTENSITY Progesterone Receptor: 0%, NEGATIVE HER2 NOT AMPLIFIED  Proliferation Marker Ki67: 2%       02/10/2017 Initial Biopsy    Breast, left, needle core biopsy, upper inner - INVASIVE DUCTAL CARCINOMA. GRADE 1      02/10/2017 Initial Diagnosis    Malignant neoplasm of upper-inner quadrant of left breast in female, estrogen receptor positive ([place])      05/06/2017 -  Radiation Therapy     The patient saw Gery Pray, MD for radiation treatment.          HISTORY OF PRESENTING ILLNESS: 02/18/17 [name] 57 y.o. female is here because of newly diagnosed left breast cancer.  She presents to the Breast clinic today with her husband.   In the past she was diagnosed with glaucoma. She had a ovarian pregnancy in  1985 but was still able to have 2 children afterward. She smoked several years ago but no longer. Her maternal aunt had breast cancer in her 61s. Her mother had lung caner from smoking.   Today she reports this lump was found by screening mammogram and she had yearly screening before that were normal. She denies any body change in weight, breast appearence, energy or appetite.  She takes vitamin Fish oil.   GYN HISTORY  Menarchal: 13 LMP: age 3 Contraceptive: 1977-2009 HRT: No GP: G5P3A2, miscarriage and ovarian pregnancy. 57 yo at first pregnancy. She has 1 daughter 1 son.   INTERIM HISTORY:  [name] presents today for routine follow up of her left breast cancer. She presents to the clinic today alone. Pt had a left breast partial mastectomy on 03/24/17. She is doing well overall but reports some mild fatigue and redness to the area where she is receiving radiation over the left breast.   On review of systems, pt denies fever, chills, rash, mouth sores, weight loss, decreased appetite, urinary complaints. Pt denies abdominal pain, nausea, vomiting. Pertinent positives are listed and detailed within the above HPI.  MEDICAL HISTORY:  Past Medical History:  Diagnosis Date  . Breast cancer, left ([place]) 01/2017  . Glaucoma    no current med.  Marland Kitchen PONV (postoperative nausea and vomiting)     SURGICAL HISTORY: Past Surgical History:  Procedure Laterality Date  . BREAST LUMPECTOMY WITH RADIOACTIVE SEED AND SENTINEL LYMPH NODE BIOPSY Left 03/24/2017   Procedure: BREAST PARTIAL MASTECTOMY WITH RADIOACTIVE SEED AND  SENTINEL LYMPH NODE BIOPSY;  Surgeon: Erroll Luna, MD;  Location: [facility];  Service: General;  Laterality: Left;  . CATARACT EXTRACTION W/ INTRAOCULAR LENS IMPLANT      SOCIAL HISTORY: Social History   Socioeconomic History  . Marital status: Married    Spouse name: Not on file  . Number of children: 3  . Years of education: Not on file  . Highest  education level: Not on file  Social Needs  . Financial resource strain: Not on file  . Food insecurity - worry: Not on file  . Food insecurity - inability: Not on file  . Transportation needs - medical: Not on file  . Transportation needs - non-medical: Not on file  Occupational History  . Occupation: Engineer, maintenance (IT)  Tobacco Use  . Smoking status: Former Smoker    Last attempt to quit: 05/26/1983    Years since quitting: 34.0  . Smokeless tobacco: Never Used  Substance and Sexual Activity  . Alcohol use: Yes    Comment: occasionally  . Drug use: No  . Sexual activity: Not on file  Other Topics Concern  . Not on file  Social History Narrative  . Not on file    FAMILY HISTORY: Family History  Problem Relation Age of Onset  . Lung cancer Mother   . Breast cancer Maternal Aunt     ALLERGIES:  is allergic to latex.  MEDICATIONS:  Current Outpatient Medications  Medication Sig Dispense Refill  . anastrozole (ARIMIDEX) 1 MG tablet Take 1 tablet (1 mg total) by mouth daily. 30 tablet 2  . ibuprofen (ADVIL,MOTRIN) 800 MG tablet Take 1 tablet (800 mg total) by mouth every 8 (eight) hours as needed. (Patient not taking: Reported on 04/13/2017) 30 tablet 0  . silver sulfADIAZINE (SILVADENE) 1 % cream Apply 1 application topically 2 (two) times daily.     No current facility-administered medications for this visit.     REVIEW OF SYSTEMS:   Constitutional: Denies fevers, chills or abnormal night sweats Eyes: Denies blurriness of vision, double vision or watery eyes Ears, nose, mouth, throat, and face: Denies mucositis or sore throat Respiratory: Denies cough, dyspnea or wheezes Cardiovascular: Denies palpitation, chest discomfort or lower extremity swelling Gastrointestinal:  Denies nausea, heartburn or change in bowel habits Skin: Denies abnormal skin rashes Lymphatics: Denies new lymphadenopathy or easy bruising Neurological:Denies numbness, tingling or new  weaknesses Behavioral/Psych: Mood is stable, no new changes  All other systems were reviewed with the patient and are negative.  PHYSICAL EXAMINATION: ECOG PERFORMANCE STATUS: 0 - Asymptomatic  Vitals:   06/03/17 1418  BP: 134/77  Pulse: 65  Resp: 20  Temp: 98.2 F (36.8 C)  SpO2: 99%   Filed Weights   06/03/17 1418  Weight: 202 lb 6.4 oz (91.8 kg)    GENERAL:alert, no distress and comfortable SKIN: skin color, texture, turgor are normal, no rashes or significant lesions EYES: normal, conjunctiva are pink and non-injected, sclera clear OROPHARYNX:no exudate, no erythema and lips, buccal mucosa, and tongue normal  NECK: supple, thyroid normal size, non-tender, without nodularity LYMPH:  no palpable lymphadenopathy in the cervical, axillary or inguinal LUNGS: clear to auscultation and percussion with normal breathing effort HEART: regular rate & rhythm and no murmurs and no lower extremity edema ABDOMEN:abdomen soft, non-tender and normal bowel sounds Musculoskeletal:no cyanosis of digits and no clubbing  PSYCH: alert & oriented x 3 with fluent speech NEURO: no focal motor/sensory deficits Breast: Left breast with diffuse hyperpigmentation consistent with radiation  therapy.    LABORATORY DATA:  I have reviewed the data as listed CBC Latest Ref Rng & Units 02/18/2017  WBC 3.9 - 10.3 10e3/uL 6.0  Hemoglobin 11.6 - 15.9 g/dL 13.4  Hematocrit 34.8 - 46.6 % 40.1  Platelets 145 - 400 10e3/uL 163    CMP Latest Ref Rng & Units 02/18/2017  Glucose 70 - 140 mg/dl 95  BUN 7.0 - 26.0 mg/dL 14.0  Creatinine 0.6 - 1.1 mg/dL 0.8  Sodium 136 - 145 mEq/L 140  Potassium 3.5 - 5.1 mEq/L 4.3  CO2 22 - 29 mEq/L 27  Calcium 8.4 - 10.4 mg/dL 9.6  Total Protein 6.4 - 8.3 g/dL 7.6  Total Bilirubin 0.20 - 1.20 mg/dL 0.64  Alkaline Phos 40 - 150 U/L 55  AST 5 - 34 U/L 20  ALT 0 - 55 U/L 19    PATHOLOGY   Diagnosis Breast, left, needle core biopsy, upper inner - INVASIVE DUCTAL  CARCINOMA. - SEE COMMENT. Microscopic Comment There is a microscopic focus of grade I invasive ductal carcinoma as confirmed by lack of myoepithelial cells based on smooth muscle myosin, calponin, and p63 stains. Dr Claudette Laws has reviewed the case and concurs with this interpretation. A full breast prognostic profile will be attempted and the results reported separately. The results were called to [facility] on 02/11/17. (JBK:gt:ecj, 02/11/17) FLUORESCENCE IN-SITU HYBRIDIZATION Results: HER2 - NEGATIVE RATIO OF HER2/CEP17 SIGNALS 1.15 AVERAGE HER2 COPY NUMBER PER CELL 1.90 PROGNOSTIC INDICATORS Results: IMMUNOHISTOCHEMICAL AND MORPHOMETRIC ANALYSIS PERFORMED MANUALLY Estrogen Receptor: 90%, POSITIVE, STRONG STAINING INTENSITY Progesterone Receptor: 0%, NEGATIVE Proliferation Marker Ki67: 2%    RADIOGRAPHIC STUDIES: I have personally reviewed the radiological images as listed and agreed with the findings in the report. No results found.  ASSESSMENT & PLAN:  [name] is a 56 y.o. female with  A history of glaucoma.    1. Malignant neoplasm of upper-inner quadrant of left breast, invasive ductal carcinoma, p(T1a, N0, MX), likely stage I, ER positive, PR Negative, HER2 Negative, Grade 1 -We discussed her surgical pathology results in great details. -She had lumpectomy and sentinel lymph node biopsy, which showed a 5 mm tumor, margins were negative, nodes were negative. -Given the small size of tumor, low-grade, she does not need adjuvant chemotherapy. --She will finish a course of radiotherapy to the left breast tomorrow, per Dr Sondra Come.  --Given the strong ER and PR positivity and postmenopausal status, I do recommend adjuvant aromatase inhibitor to reduce her risk of cancer recurrence,  The potential benefit and side effects, which includes but not limited to, hot flash, skin and vaginal dryness, metabolic changes ( increased blood glucose, cholesterol,  weight, etc.), slightly in increased risk of cardiovascular disease, cataracts, muscular and joint discomfort, osteopenia and osteoporosis, etc, were discussed with her in great details. She initially was reluctant to consider due to the concern of side effects, after lengthy discussion, she agrees to try.   -We also discussed the breast cancer surveillance after her surgery. She will continue annual screening mammogram, self exam, and a routine office visit with lab and exam with [place]. -I again encouraged her to have healthy diet and exercise regularly.  -Labs reviewed and her CBC and CMP and Liver function are with in normal limits.  --Discussed the need for a bone density scan because the pt is post-menopausal.  Discussed follow up routine and surveillance testing. Will meet 3 months from today, then every 6 months and every year close to  5 years post surgery.   2. Bone Health -She has not previously had a bone density scan. -I discussed with her again today that I would like for her to have this performed before we see her again.   PLAN:  -I Karthikeyan anastrozole 1 mg daily, to be started in 2-3 weeks  -She will get a bone density scan in the next months at [facility] -lab and f/u in 3 months    No orders of the defined types were placed in this encounter.   All questions were answered. The patient knows to call the clinic with any problems, questions or concerns. I spent 25 minutes counseling the patient face to face. The total time spent in the appointment was 30 minutes and more than 50% was on counseling.     Truitt Merle, MD 06/03/2017   This document serves as a record of services personally performed by Truitt Merle, MD. It was created on her behalf by Theresia Bough, a trained medical scribe. The creation of this record is based on the scribe's personal observations and the provider's statements to them.   I have reviewed the above documentation for accuracy and completeness, and I agree with  the above.

## 2017-06-04 ENCOUNTER — Ambulatory Visit
Admission: RE | Admit: 2017-06-04 | Discharge: 2017-06-04 | Disposition: A | Discharge status: Home / Self Care | Payer: BLUE CROSS/BLUE SHIELD | Source: Ambulatory Visit | Attending: Radiation Oncology | Admitting: Radiation Oncology

## 2017-06-04 ENCOUNTER — Encounter: Payer: Self-pay | Admitting: Hematology

## 2017-06-04 ENCOUNTER — Encounter: Payer: Self-pay | Admitting: Radiation Oncology

## 2017-06-04 ENCOUNTER — Ambulatory Visit: Payer: BLUE CROSS/BLUE SHIELD

## 2017-06-04 DIAGNOSIS — E2839 Other primary ovarian failure: Secondary | ICD-10-CM | POA: Insufficient documentation

## 2017-06-04 DIAGNOSIS — Z510 Encounter for antineoplastic radiation therapy: Secondary | ICD-10-CM | POA: Diagnosis not present

## 2017-06-05 ENCOUNTER — Telehealth: Payer: Self-pay | Admitting: Hematology

## 2017-06-05 NOTE — Telephone Encounter (Signed)
Called regarding 4/11

## 2017-06-08 NOTE — Progress Notes (Signed)
  Radiation Oncology         [phone] ________________________________  Name: [name] MRN: [id number]  Date: 06/04/2017  DOB: [birth year]  End of Treatment Note  Diagnosis:   57 y.o. 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      Indication for treatment:  Curative       Radiation treatment dates:   05/06/17 - 06/04/17  Site/dose:    1. Left breast// 40.05 Gy in 15 fractions. 2. Boost // 10 Gy in 5 fractions.  Beams/energy:   1.  Left breast; Photon // 3D 2. Boost; Photon// Complex Isodose treatment  Narrative: The patient tolerated radiation treatment relatively well. Patient reported that her nipple was tender. She reported having mild fatigue and pain. Skin had  a rash and was hyperpigmented. Patient reported her skin being itchy and burning. Used hydrocortisone and sonafine to help with skin.  Plan: The patient has completed radiation treatment. The patient will return to radiation oncology clinic for routine followup in one month. I advised them to call or return sooner if they have any questions or concerns related to their recovery or treatment.  -----------------------------------  Blair Promise, PhD, MD  This document serves as a record of services personally performed by Gery Pray MD. It was created on his behalf by Delton Coombes, a trained medical scribe. The creation of this record is based on the scribe's personal observations and the provider's statements to them.

## 2017-06-24 ENCOUNTER — Other Ambulatory Visit: Payer: BLUE CROSS/BLUE SHIELD

## 2017-07-08 ENCOUNTER — Ambulatory Visit
Admission: RE | Admit: 2017-07-08 | Discharge: 2017-07-08 | Disposition: A | Discharge status: Home / Self Care | Payer: BLUE CROSS/BLUE SHIELD | Source: Ambulatory Visit | Attending: Hematology | Admitting: Hematology

## 2017-07-08 DIAGNOSIS — E2839 Other primary ovarian failure: Secondary | ICD-10-CM

## 2017-07-15 ENCOUNTER — Encounter: Payer: Self-pay | Admitting: Oncology

## 2017-07-15 ENCOUNTER — Telehealth: Payer: Self-pay | Admitting: Hematology

## 2017-07-15 ENCOUNTER — Telehealth: Payer: Self-pay | Admitting: *Deleted

## 2017-07-15 MED ORDER — VITAMIN D 1000 UNITS PO TABS: 1000.0000 [IU] | ORAL_TABLET | Freq: Every day | ORAL | Status: AC

## 2017-07-15 MED ORDER — CALCIUM CARBONATE 600 MG PO TABS: 600.0000 mg | ORAL_TABLET | Freq: Two times a day (BID) | ORAL | Status: AC

## 2017-07-15 NOTE — Telephone Encounter (Signed)
Patient left voicemail requesting appt for  4/11 be rescheduled to 4/22.  Appt moved per her request

## 2017-07-15 NOTE — Telephone Encounter (Signed)
TCT patient regarding recent bone density scan.  Spoke with patient and informed her that her bone density scan. Informed patient that the scan revealed osteoporosis and that Dr. Burr Medico recommends supplemental, non-prescription Calcium and Vit. D. Pt is currently taking Vit D1000 iu. She states she will pick up calcium today.  She is aware of her appt with Dr. Burr Medico in April.  No other questions or concerns.

## 2017-07-16 ENCOUNTER — Ambulatory Visit
Admission: RE | Admit: 2017-07-16 | Discharge: 2017-07-16 | Disposition: A | Discharge status: Home / Self Care | Payer: BLUE CROSS/BLUE SHIELD | Source: Ambulatory Visit | Attending: Radiation Oncology | Admitting: Radiation Oncology

## 2017-07-16 ENCOUNTER — Other Ambulatory Visit: Payer: Self-pay

## 2017-07-16 ENCOUNTER — Encounter: Payer: Self-pay | Admitting: Radiation Oncology

## 2017-07-16 DIAGNOSIS — Z79811 Long term (current) use of aromatase inhibitors: Secondary | ICD-10-CM | POA: Diagnosis not present

## 2017-07-16 DIAGNOSIS — Z170 Estrogen receptor positive status [ER+]: Secondary | ICD-10-CM | POA: Insufficient documentation

## 2017-07-16 DIAGNOSIS — C50212 Malignant neoplasm of upper-inner quadrant of left female breast: Secondary | ICD-10-CM | POA: Diagnosis present

## 2017-07-16 DIAGNOSIS — Z923 Personal history of irradiation: Secondary | ICD-10-CM | POA: Insufficient documentation

## 2017-07-16 NOTE — Progress Notes (Addendum)
[name] is here for follow up after treatment to her left breast.  She denies having pain.  She is taking anastrozole.  She reports feeling fatigued but is working long hours doing taxes.  The skin on her left breast has slight hyperpigmentation.  BP 129/82 (BP Location: Right Arm, Patient Position: Sitting)   Pulse 69   Temp 98.8 F (37.1 C) (Oral)   Ht 5' 4.5" (1.638 m)   Wt 199 lb 9.6 oz (90.5 kg)   SpO2 98%   BMI 33.73 kg/m    Wt Readings from Last 3 Encounters:  07/16/17 199 lb 9.6 oz (90.5 kg)  06/03/17 202 lb 6.4 oz (91.8 kg)  04/13/17 203 lb 3.2 oz (92.2 kg)

## 2017-07-16 NOTE — Progress Notes (Signed)
Radiation Oncology         [phone] ________________________________  Name: [name] MRN: [id number]  Date: 07/16/2017  DOB: February 06, 1961  Follow-Up Visit Note  CC: Shelby Resides, MD  Shelby Merle, MD    ICD-10-CM   1. Malignant neoplasm of upper-inner quadrant of left breast in female, estrogen receptor positive ([place]) C50.212    Z17.0     Diagnosis:  57 y.o. 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      Interval Since Last Radiation:  1 month, 11 days   Radiation treatment dates:   05/06/17 - 06/04/17  Site/dose:    1. Left breast// 40.05 Gy in 15 fractions. 2. Boost // 10 Gy in 5 fractions.  Narrative:  The patient returns today for routine follow-up.  She is on anastrozole which she started 1 month ago, which she is tolerating well at this time. She denies hot flashes or night sweats at this time.   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 She denies breast soreness/discomfort and any other symptoms.                                 ALLERGIES:  is allergic to latex.  Meds: Current Outpatient Medications  Medication Sig Dispense Refill  . anastrozole (ARIMIDEX) 1 MG tablet Take 1 tablet (1 mg total) by mouth daily. 30 tablet 2  . cholecalciferol (VITAMIN D) 1000 units tablet Take 1 tablet (1,000 Units total) by mouth daily.    Marland Kitchen ACZONE 7.5 % GEL APPLY ON THE SKIN NIGHTLY APPLY TO FACE NIGHTLY AS DIRECTED  2  . calcium carbonate (CALCIUM 600) 600 MG TABS tablet Take 1 tablet (600 mg total) by mouth 2 (two) times daily with a meal. (Patient not taking: Reported on 07/16/2017) 60 tablet    No current facility-administered medications for this encounter.     Physical Findings: The patient is in no acute distress. Patient is alert and oriented.  height is 5' 4.5" (1.638 m) and weight is 199 lb 9.6 oz (90.5 kg). Her oral temperature  is 98.8 F (37.1 C). Her blood pressure is 129/82 and her pulse is 69. Her oxygen saturation is 98%.  Lungs are clear to auscultation bilaterally. Heart has regular rate and rhythm. No palpable cervical, supraclavicular, or axillary adenopathy. Abdomen soft, non-tender, normal bowel sounds. Right breast with no palpable mass, nipple discharge, or bleeding. Left breast with mild hyperpigmentation changes. Skin well healed. No palpable or visible signs of recurrence.  Lab Findings: Lab Results  Component Value Date   WBC 6.0 02/18/2017   HGB 13.4 02/18/2017   HCT 40.1 02/18/2017   MCV 92.2 02/18/2017   PLT 163 02/18/2017    Radiographic Findings: Dg Bone Density  Result Date: 07/08/2017 EXAM: DUAL X-RAY ABSORPTIOMETRY (DXA) FOR BONE MINERAL DENSITY IMPRESSION: Referring Physician:  Truitt Rivera PATIENT: Name: [name], [name] Patient ID: [id number] Birth Date: 01/04/1961 Height: 64.0 in. Sex: Female Measured: 07/08/2017 Weight: 199.7 lbs. Indications: Anastrazole, Estrogen Deficient, Postmenopausal Fractures: None Treatments: Hormone Therapy For Cancer, Multivitamin, Vitamin D (E933.5) ASSESSMENT: The BMD measured at Femur Neck Left is 0.790 g/cm2 with a T-score of -1.8. This patient is considered OSTEOPENIC according to [facility]) criteria. Site Region Measured Date Measured Age 5  T-score BMD Significant CHANGE DualFemur Neck Left 07/08/2017    [age]         -1.8    0.790 g/cm2 AP Spine  L1-L4     07/08/2017    [age]         -0.8    1.093 g/cm2 World Health Organization [facility]) criteria for post-menopausal, Caucasian Women: Normal       T-score at or above -1 SD Osteopenia   T-score between -1 and -2.5 SD Osteoporosis T-score at or below -2.5 SD RECOMMENDATION: [place] recommends that FDA-approved medical therapies be considered in postmenopausal women and men age 72 or older with a: 1. Hip or vertebral (clinical or morphometric) fracture. 2. T-score of <-2.5  at the spine or hip. 3. Ten-year fracture probability by FRAX of 3% or greater for hip fracture or 20% or greater for major osteoporotic fracture. All treatment decisions require clinical judgment and consideration of individual patient factors, including patient preferences, co-morbidities, previous drug use, risk factors not captured in the FRAX model (e.g. falls, vitamin D deficiency, increased bone turnover, interval significant decline in bone density) and possible under - or over-estimation of fracture risk by FRAX. All patients should ensure an adequate intake of dietary calcium (1200 mg/d) and vitamin D (800 IU daily) unless contraindicated. FOLLOW-UP: People with diagnosed cases of osteoporosis or at high risk for fracture should have regular bone mineral density tests. For patients eligible for Medicare, routine testing is allowed once every 2 years. The testing frequency can be increased to one year for patients who have rapidly progressing disease, those who are receiving or discontinuing medical therapy to restore bone mass, or have additional risk factors. I have reviewed this report, and agree with the above findings. Mark A. Thornton Papas, M.D. [facility] FRAX* 10-year Probability of Fracture Based on femoral neck BMD: DualFemur (Left) Major Osteoporotic Fracture: 7.7% Hip Fracture:                0.8% Population:                  [place] (Caucasian) Risk Factors:                None *FRAX is a Materials engineer of the [employer] for Metabolic Bone Disease, a World Pharmacologist (WHO) [facility]. ASSESSMENT: The probability of a major osteoporotic fracture is 7.7 % within the next ten years. The probability of hip fracture is  0.8  % within the next 10 years. I have reviewed this report and agree with the above findings. Mark A. Thornton Papas, M.D. [facility] Electronically Signed   By: Lavonia Dana M.D.   On: 07/08/2017 08:56    Impression:  The patient  is recovering from the effects of radiation. No evidence of recurrence on clinical exam. 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   Plan:  PRN follow up in Radiation Oncology. She will continue close follow up with medical oncology and continue on hormonal therapy.   ____________________________________   Blair Promise, PhD, MD    This document serves as a record of services personally performed by Gery Pray, MD. It was created on his behalf by [facility], a trained medical scribe. The creation of this record is based on the scribe's personal observations and the provider's statements to them. This document has been checked and approved by the attending provider.

## 2017-09-03 ENCOUNTER — Other Ambulatory Visit: Payer: BLUE CROSS/BLUE SHIELD

## 2017-09-03 ENCOUNTER — Ambulatory Visit: Payer: BLUE CROSS/BLUE SHIELD | Admitting: Hematology

## 2017-09-11 ENCOUNTER — Other Ambulatory Visit: Payer: Self-pay | Admitting: Hematology

## 2017-09-11 NOTE — Progress Notes (Signed)
[facility]  [phone]) [phone] Fax:(336) [phone]  Clinic Follow Up Note   Patient Care Team: [name], MD as PCP - General (Family Medicine) Erroll Luna, MD as Consulting Physician (General Surgery) Truitt Merle, MD as Consulting Physician (Hematology) Gery Pray, MD as Consulting Physician (Radiation Oncology) 09/14/2017  CHIEF COMPLAINTS:  Follow Up Left Breast Cancer   Oncology History   Cancer Staging Malignant neoplasm of upper-inner quadrant of left breast in female, estrogen receptor positive ([place]) Staging form: Breast, AJCC 8th Edition - Clinical stage from 02/10/2017: Stage Unknown (cTX, cN0, cM0, G1, ER: Positive, PR: Negative, HER2: Negative) - Signed by Truitt Merle, MD on 02/17/2017       Malignant neoplasm of upper-inner quadrant of left breast in female, estrogen receptor positive ([place])   01/23/2017 Mammogram    DIAGNOSTIC MAMMOGRAM AND US IMPRESSION: Small area of architectural distortion in the posterior upper left breast at approximately 11 o'clock. Tissue sampling is recommended. [place] of left breast and axilla was negative       02/10/2017 Receptors her2    Estrogen Receptor: 90%, POSITIVE, STRONG STAINING INTENSITY Progesterone Receptor: 0%, NEGATIVE HER2 NOT AMPLIFIED  Proliferation Marker Ki67: 2%       02/10/2017 Initial Biopsy    Breast, left, needle core biopsy, upper inner - INVASIVE DUCTAL CARCINOMA. GRADE 1      02/10/2017 Initial Diagnosis    Malignant neoplasm of upper-inner quadrant of left breast in female, estrogen receptor positive ([place])      05/06/2017 -  Radiation Therapy     The patient saw Gery Pray, MD for radiation treatment.          HISTORY OF PRESENTING ILLNESS: 02/18/17 [name] 57 y.o. female is here because of newly diagnosed left breast cancer.  She presents to the Breast clinic today with her husband.   In the past she was diagnosed with glaucoma. She had a ovarian  pregnancy in 1985 but was still able to have 2 children afterward. She smoked several years ago but no longer. Her maternal aunt had breast cancer in her 30s. Her mother had lung caner from smoking.   Today she reports this lump was found by screening mammogram and she had yearly screening before that were normal. She denies any body change in weight, breast appearence, energy or appetite.  She takes vitamin Fish oil.   GYN HISTORY  Menarchal: 13 LMP: age 53 Contraceptive: 1977-2009 HRT: No GP: G5P3A2, miscarriage and ovarian pregnancy. 57 yo at first pregnancy. She has 1 daughter 1 son.   CURRENT THERAPY: Anastrozole started on 06/18/17  INTERIM HISTORY:  [name] presents today for routine follow up of her left breast cancer. She presents to the clinic today by herself. She reports she is doing well overall. She reports she probably has a sinus infection. She notes to have green snot, facial tenderness, headaches and some sneezing. She has had a sinus infection in the past.   She started Anastrozole after our last visit in Jan 2019. She reports no complaints. She is having some tenderness and Mildly limited ROM with her left breast/arm due to her surgery and radiation.   On review of systems, pt denies hot flashes, joint pain or any other complaints at this time. Pertinent positives are listed and detailed within the above HPI.   MEDICAL HISTORY:  Past Medical History:  Diagnosis Date  . Breast cancer, left ([place]) 01/2017  . Glaucoma    no current med.  Marland Kitchen  History of radiation therapy 05/06/17-06/04/17   left breast 40.05 Gy in 15 fractions, left breast boost 10 Gy in 5 fractions  . PONV (postoperative nausea and vomiting)     SURGICAL HISTORY: Past Surgical History:  Procedure Laterality Date  . BREAST LUMPECTOMY WITH RADIOACTIVE SEED AND SENTINEL LYMPH NODE BIOPSY Left 03/24/2017   Procedure: BREAST PARTIAL MASTECTOMY WITH RADIOACTIVE SEED AND SENTINEL LYMPH NODE BIOPSY;   Surgeon: Erroll Luna, MD;  Location: [facility];  Service: General;  Laterality: Left;  . CATARACT EXTRACTION W/ INTRAOCULAR LENS IMPLANT      SOCIAL HISTORY: Social History   Socioeconomic History  . Marital status: Married    Spouse name: Not on file  . Number of children: 3  . Years of education: Not on file  . Highest education level: Not on file  Occupational History  . Occupation: Engineer, maintenance (IT)  Social Needs  . Financial resource strain: Not on file  . Food insecurity:    Worry: Not on file    Inability: Not on file  . Transportation needs:    Medical: Not on file    Non-medical: Not on file  Tobacco Use  . Smoking status: Former Smoker    Last attempt to quit: 05/26/1983    Years since quitting: 34.3  . Smokeless tobacco: Never Used  Substance and Sexual Activity  . Alcohol use: Yes    Comment: occasionally  . Drug use: No  . Sexual activity: Not on file  Lifestyle  . Physical activity:    Days per week: Not on file    Minutes per session: Not on file  . Stress: Not on file  Relationships  . Social connections:    Talks on phone: Not on file    Gets together: Not on file    Attends religious service: Not on file    Active member of club or organization: Not on file    Attends meetings of clubs or organizations: Not on file    Relationship status: Not on file  . Intimate partner violence:    Fear of current or ex partner: Not on file    Emotionally abused: Not on file    Physically abused: Not on file    Forced sexual activity: Not on file  Other Topics Concern  . Not on file  Social History Narrative  . Not on file    FAMILY HISTORY: Family History  Problem Relation Age of Onset  . Lung cancer Mother   . Breast cancer Maternal Aunt     ALLERGIES:  is allergic to latex.  MEDICATIONS:  Current Outpatient Medications  Medication Sig Dispense Refill  . ACZONE 7.5 % GEL APPLY ON THE SKIN NIGHTLY APPLY TO FACE NIGHTLY AS DIRECTED  2  .  anastrozole (ARIMIDEX) 1 MG tablet Take 1 tablet (1 mg total) by mouth daily. 90 tablet 3  . calcium carbonate (CALCIUM 600) 600 MG TABS tablet Take 1 tablet (600 mg total) by mouth 2 (two) times daily with a meal. 60 tablet   . cholecalciferol (VITAMIN D) 1000 units tablet Take 1 tablet (1,000 Units total) by mouth daily.    Marland Kitchen amoxicillin-clavulanate (AUGMENTIN) 875-125 MG tablet Take 1 tablet by mouth 2 (two) times daily. 14 tablet 0   No current facility-administered medications for this visit.    REVIEW OF SYSTEMS:   Constitutional: Denies fevers, chills or abnormal night sweats Eyes: Denies blurriness of vision, double vision or watery eyes Ears, nose, mouth, throat, and  face: (+) congestion  Respiratory: Denies cough, dyspnea or wheezes Cardiovascular: Denies palpitation, chest discomfort or lower extremity swelling Gastrointestinal:  Denies nausea, heartburn or change in bowel habits Skin: Denies abnormal skin rashes Lymphatics: Denies new lymphadenopathy or easy bruising Neurological:Denies numbness, tingling or new weaknesses Behavioral/Psych: Mood is stable, no new changes  MSK: (+) left arm mildly limited ROM due to surgery All other systems were reviewed with the patient and are negative.  PHYSICAL EXAMINATION: ECOG PERFORMANCE STATUS: 0 - Asymptomatic  Vitals:   09/14/17 0904  BP: (!) 144/77  Pulse: 66  Resp: 18  Temp: 98.5 F (36.9 C)  SpO2: 100%   Filed Weights   09/14/17 0904  Weight: 200 lb 3.2 oz (90.8 kg)    GENERAL:alert, no distress and comfortable SKIN: skin color, texture, turgor are normal, no rashes or significant lesions EYES: normal, conjunctiva are pink and non-injected, sclera clear OROPHARYNX:no exudate, no erythema and lips, buccal mucosa, and tongue normal  NECK: supple, thyroid normal size, non-tender, without nodularity LYMPH:  no palpable lymphadenopathy in the cervical, axillary or inguinal LUNGS: clear to auscultation and percussion  with normal breathing effort HEART: regular rate & rhythm and no murmurs and no lower extremity edema ABDOMEN:abdomen soft, non-tender and normal bowel sounds Musculoskeletal:no cyanosis of digits and no clubbing  PSYCH: alert & oriented x 3 with fluent speech NEURO: no focal motor/sensory deficits Breast: Left breast with diffuse hyperpigmentation consistent with radiation therapy.  Breast inspection showed them to be symmetrical with no nipple discharge. Palpation of the breasts and axilla revealed no obvious mass that I could appreciate.   LABORATORY DATA:  I have reviewed the data as listed CBC Latest Ref Rng & Units 09/14/2017 02/18/2017  WBC 3.9 - 10.3 K/uL 6.0 6.0  Hemoglobin 11.6 - 15.9 g/dL 13.4 13.4  Hematocrit 34.8 - 46.6 % 41.1 40.1  Platelets 145 - 400 K/uL 139(L) 163    CMP Latest Ref Rng & Units 09/14/2017 02/18/2017  Glucose 70 - 140 mg/dL 99 95  BUN 7 - 26 mg/dL 16 14.0  Creatinine 0.60 - 1.10 mg/dL 0.80 0.8  Sodium 136 - 145 mmol/L 138 140  Potassium 3.5 - 5.1 mmol/L 4.2 4.3  Chloride 98 - 109 mmol/L 104 -  CO2 22 - 29 mmol/L 26 27  Calcium 8.4 - 10.4 mg/dL 10.1 9.6  Total Protein 6.4 - 8.3 g/dL 7.5 7.6  Total Bilirubin 0.2 - 1.2 mg/dL 0.6 0.64  Alkaline Phos 40 - 150 U/L 64 55  AST 5 - 34 U/L 19 20  ALT 0 - 55 U/L 21 19    PATHOLOGY   Diagnosis Breast, left, needle core biopsy, upper inner - INVASIVE DUCTAL CARCINOMA. - SEE COMMENT. Microscopic Comment There is a microscopic focus of grade I invasive ductal carcinoma as confirmed by lack of myoepithelial cells based on smooth muscle myosin, calponin, and p63 stains. Dr Claudette Laws has reviewed the case and concurs with this interpretation. A full breast prognostic profile will be attempted and the results reported separately. The results were called to [facility] on 02/11/17. (JBK:gt:ecj, 02/11/17) FLUORESCENCE IN-SITU HYBRIDIZATION Results: HER2 - NEGATIVE RATIO OF HER2/CEP17 SIGNALS  1.15 AVERAGE HER2 COPY NUMBER PER CELL 1.90 PROGNOSTIC INDICATORS Results: IMMUNOHISTOCHEMICAL AND MORPHOMETRIC ANALYSIS PERFORMED MANUALLY Estrogen Receptor: 90%, POSITIVE, STRONG STAINING INTENSITY Progesterone Receptor: 0%, NEGATIVE Proliferation Marker Ki67: 2%  RADIOGRAPHIC STUDIES: I have personally reviewed the radiological images as listed and agreed with the findings in the report. No results  found.   DEXA 07/08/17 ASSESSMENT: The BMD measured at Femur Neck Left is 0.790 g/cm2 with a T-score of -1.8.  ASSESSMENT & PLAN:  [name] is a 57 y.o. female with  A history of glaucoma.    1. 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 She had lumpectomy and sentinel lymph node biopsy, which showed a 5 mm tumor, margins were negative, nodes were negative. -Given the small size of tumor, low-grade, she does not need adjuvant chemotherapy. -She completed RT with Dr. Sondra Come on 06/04/17 -She started adjuvant Anastrozole in Jan 2019, tolerating very well. Plan for 5-7 years.  -She is clinically doing well. Labs reviewed and her CBC is otherwise normal, CMP is pending. Her physical exam today was unremarkable except some mild pain with full ROM, so I recommended her to go to PT. There is no clinical concern for recurrence.  -She is due for a mammogram in Aug 2019, ordered today  -Continue breast cancer surveillance.  Survivorship in 3 months.  F/u in 5 months    2. Osteopenia -She has not previously had a bone density scan. -I previously discussed with her again today that I would like for her to have this performed before we see her again.  -DEXA from 07/08/17 revealed a T score of -1.8 at the left femur neck  - I discussed her options to increase her bone strength with pill, injection  or infusion. She is not at a high risk for Fracture in the next 10 years and she had low risk breast cancer so I do not think she needs to consider these options right now. Will repeat DEXA in 2 years -I discussed I can change her to Tamoxifen if she progresses Osteoporosis.  -I recommended her to take Ca and Vit D as well as being active and adding some weight bearing exercise for now  3. Acute Sinus Infection -She has symptoms of congestion, maxillary tenderness, headaches and green snot. Sh ehas had a sinus infection in the past. I prescribed Augmentin today and advised he to f/u with her PCP if her symptoms do not improve  PLAN:  -Continue Anastrozole -Survivorship clinic in 2-3 months -Lab andf/u with me in 5 months -prescribed Augmentin for sinus infection -Mammogram at [facility] in Aug 2019   Orders Placed This Encounter  Procedures  . MM DIAG BREAST TOMO BILATERAL    Standing Status:   Future    Standing Expiration Date:   09/15/2018    Order Specific Question:   Reason for Exam (SYMPTOM  OR DIAGNOSIS REQUIRED)    Answer:   screening    Order Specific Question:   Is the patient pregnant?    Answer:   No    Order Specific Question:   Preferred imaging location?    Answer:   [facility]    All questions were answered. The patient knows to call the clinic with any problems, questions or concerns. I spent 20 minutes counseling the patient face to face. The total time spent in the appointment was 25 minutes and more than 50% was on counseling.   This document serves as a record of services personally performed by Truitt Merle, MD. It was created on her behalf by Theresia Bough, a trained medical scribe. The creation of this record is based on the scribe's personal  observations and the provider's statements to them.   I have reviewed the above documentation for accuracy and completeness, and I agree with the above.    Truitt Merle, MD 09/14/2017 12:41 PM

## 2017-09-14 ENCOUNTER — Encounter: Payer: Self-pay | Admitting: Hematology

## 2017-09-14 ENCOUNTER — Inpatient Hospital Stay: Payer: BLUE CROSS/BLUE SHIELD | Attending: Hematology

## 2017-09-14 ENCOUNTER — Inpatient Hospital Stay (HOSPITAL_BASED_OUTPATIENT_CLINIC_OR_DEPARTMENT_OTHER): Payer: BLUE CROSS/BLUE SHIELD | Admitting: Hematology

## 2017-09-14 VITALS — BP 144/77 | HR 66 | Temp 98.5°F | Resp 18 | Ht 64.5 in | Wt 200.2 lb

## 2017-09-14 DIAGNOSIS — Z87891 Personal history of nicotine dependence: Secondary | ICD-10-CM | POA: Diagnosis not present

## 2017-09-14 DIAGNOSIS — J019 Acute sinusitis, unspecified: Secondary | ICD-10-CM | POA: Insufficient documentation

## 2017-09-14 DIAGNOSIS — M8580 Other specified disorders of bone density and structure, unspecified site: Secondary | ICD-10-CM | POA: Diagnosis not present

## 2017-09-14 DIAGNOSIS — Z79811 Long term (current) use of aromatase inhibitors: Secondary | ICD-10-CM | POA: Insufficient documentation

## 2017-09-14 DIAGNOSIS — C50212 Malignant neoplasm of upper-inner quadrant of left female breast: Secondary | ICD-10-CM

## 2017-09-14 DIAGNOSIS — Z923 Personal history of irradiation: Secondary | ICD-10-CM

## 2017-09-14 DIAGNOSIS — Z170 Estrogen receptor positive status [ER+]: Secondary | ICD-10-CM

## 2017-09-14 DIAGNOSIS — Z803 Family history of malignant neoplasm of breast: Secondary | ICD-10-CM | POA: Insufficient documentation

## 2017-09-14 LAB — CBC WITH DIFFERENTIAL (CANCER CENTER ONLY)
BASOS ABS: 0 10*3/uL (ref 0.0–0.1)
BASOS PCT: 1 %
Eosinophils Absolute: 0.2 10*3/uL (ref 0.0–0.5)
Eosinophils Relative: 3 %
HCT: 41.1 % (ref 34.8–46.6)
HEMOGLOBIN: 13.4 g/dL (ref 11.6–15.9)
LYMPHS PCT: 24 %
Lymphs Abs: 1.4 10*3/uL (ref 0.9–3.3)
MCH: 30.1 pg (ref 25.1–34.0)
MCHC: 32.6 g/dL (ref 31.5–36.0)
MCV: 92.4 fL (ref 79.5–101.0)
MONO ABS: 0.6 10*3/uL (ref 0.1–0.9)
Monocytes Relative: 10 %
NEUTROS PCT: 62 %
Neutro Abs: 3.8 10*3/uL (ref 1.5–6.5)
Platelet Count: 139 10*3/uL — ABNORMAL LOW (ref 145–400)
RBC: 4.45 MIL/uL (ref 3.70–5.45)
RDW: 12.9 % (ref 11.2–14.5)
WBC Count: 6 10*3/uL (ref 3.9–10.3)

## 2017-09-14 LAB — CMP (CANCER CENTER ONLY)
ALBUMIN: 4.1 g/dL (ref 3.5–5.0)
ALT: 21 U/L (ref 0–55)
ANION GAP: 8 (ref 3–11)
AST: 19 U/L (ref 5–34)
Alkaline Phosphatase: 64 U/L (ref 40–150)
BILIRUBIN TOTAL: 0.6 mg/dL (ref 0.2–1.2)
BUN: 16 mg/dL (ref 7–26)
CO2: 26 mmol/L (ref 22–29)
Calcium: 10.1 mg/dL (ref 8.4–10.4)
Chloride: 104 mmol/L (ref 98–109)
Creatinine: 0.8 mg/dL (ref 0.60–1.10)
GFR, Estimated: >60 mL/min (ref >60)
GLUCOSE: 99 mg/dL (ref 70–140)
POTASSIUM: 4.2 mmol/L (ref 3.5–5.1)
SODIUM: 138 mmol/L (ref 136–145)
TOTAL PROTEIN: 7.5 g/dL (ref 6.4–8.3)

## 2017-09-14 MED ORDER — AMOXICILLIN-POT CLAVULANATE 875-125 MG PO TABS: 1.0000 | ORAL_TABLET | Freq: Two times a day (BID) | ORAL | 0 refills | Status: DC

## 2017-09-14 MED ORDER — ANASTROZOLE 1 MG PO TABS: 1.0000 mg | ORAL_TABLET | Freq: Every day | ORAL | 3 refills | Status: DC

## 2017-09-15 ENCOUNTER — Telehealth: Payer: Self-pay | Admitting: Hematology

## 2017-09-15 NOTE — Telephone Encounter (Signed)
Appointments scheduled Calendar/Letter mailed to patient per 4/22 los

## 2017-11-04 ENCOUNTER — Telehealth: Payer: Self-pay

## 2017-11-04 NOTE — Telephone Encounter (Signed)
LVM for pt reminding of SCP visit with NP on 11/17/17 at 10 am.  Center number left for call back if needed.

## 2017-11-17 ENCOUNTER — Inpatient Hospital Stay: Payer: BLUE CROSS/BLUE SHIELD | Admitting: Adult Health

## 2017-12-27 IMAGING — MG BREAST SURGICAL SPECIMEN
1 series · 1 of 1 positions shown · non-contrast
Comparison: Previous exam(s).

CLINICAL DATA: Biopsy-proven grade 1 invasive ductal carcinoma
involving the upper inner quadrant of the left breast. Radioactive
seed localization was performed yesterday. Lumpectomy is performed
today and the specimen is submitted for evaluation.

EXAM:
SPECIMEN RADIOGRAPH OF THE LEFT BREAST

[L]
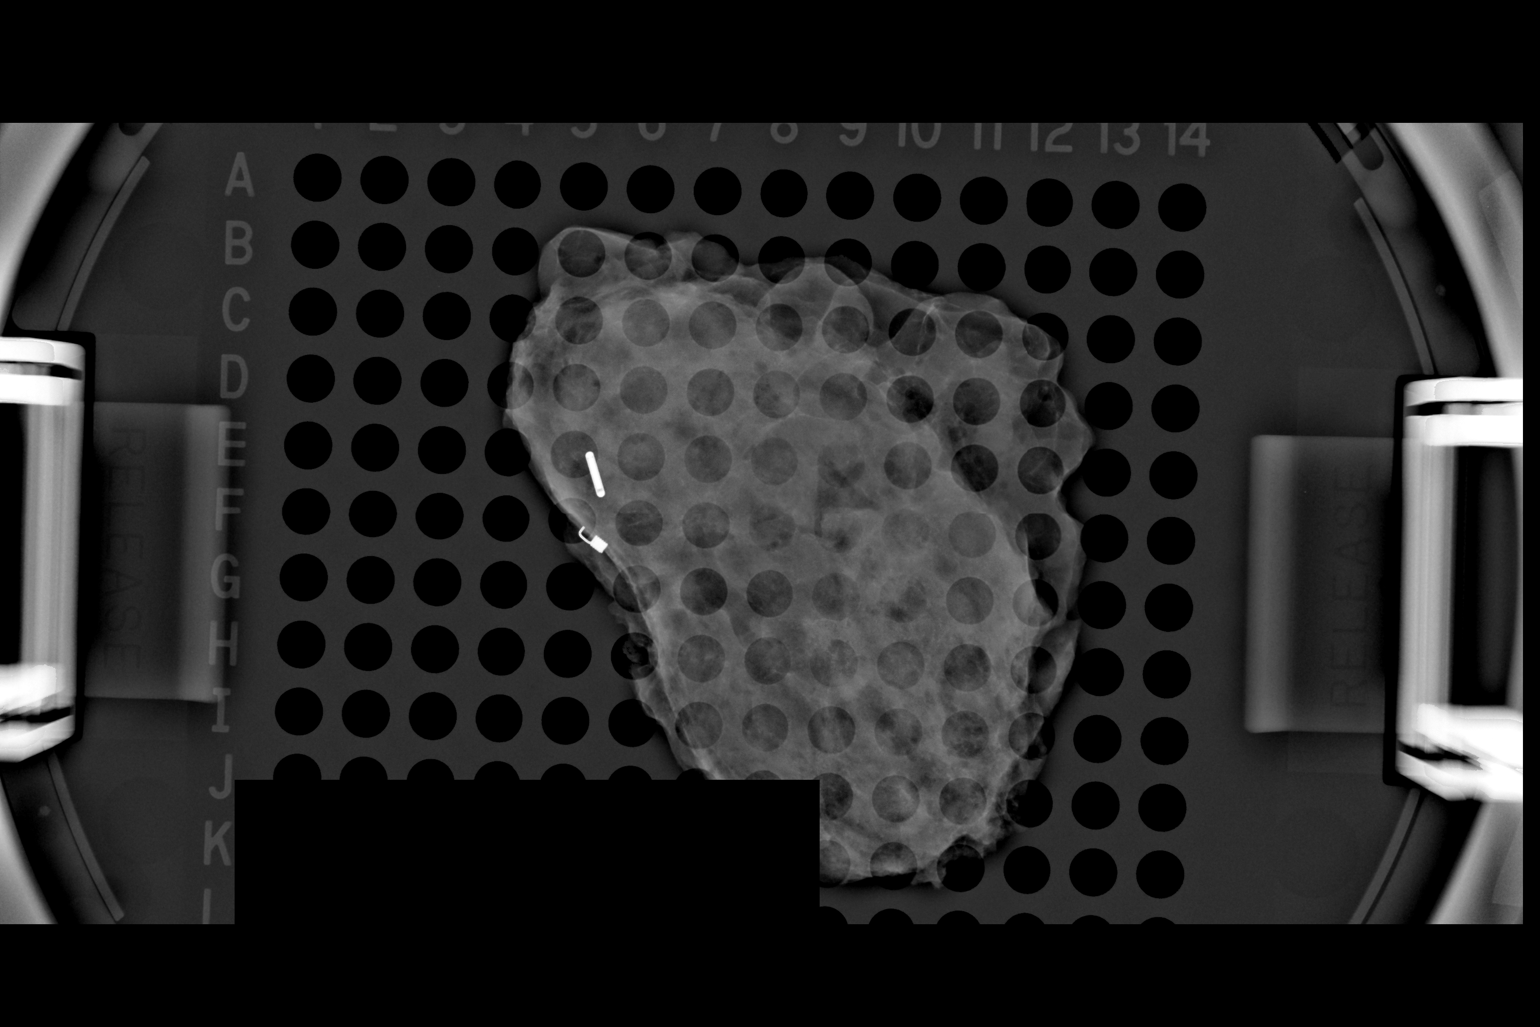

[1 of 1 positions shown; findings below may reference images not displayed]

FINDINGS: Status post excision of the left breast. The radioactive seed and
the coil shaped biopsy marker clip are present, completely intact,
and are marked for pathology. This was discussed directly with the
nurse in the operating room at the time of interpretation 03/24/2017
at [DATE] a.m..
IMPRESSION: Specimen radiograph of the left breast.

## 2018-01-06 ENCOUNTER — Ambulatory Visit
Admission: RE | Admit: 2018-01-06 | Discharge: 2018-01-06 | Disposition: A | Discharge status: Home / Self Care | Payer: BLUE CROSS/BLUE SHIELD | Source: Ambulatory Visit | Attending: Hematology | Admitting: Hematology

## 2018-01-06 DIAGNOSIS — Z170 Estrogen receptor positive status [ER+]: Principal | ICD-10-CM

## 2018-01-06 DIAGNOSIS — C50212 Malignant neoplasm of upper-inner quadrant of left female breast: Secondary | ICD-10-CM

## 2018-01-06 LAB — HM MAMMOGRAPHY

## 2018-02-08 ENCOUNTER — Telehealth: Payer: Self-pay | Admitting: Hematology

## 2018-02-08 NOTE — Telephone Encounter (Signed)
YF PAL 9/23 - moved appointments to 10/7. Spoke with patient.

## 2018-02-15 ENCOUNTER — Other Ambulatory Visit: Payer: BLUE CROSS/BLUE SHIELD

## 2018-02-15 ENCOUNTER — Ambulatory Visit: Payer: BLUE CROSS/BLUE SHIELD | Admitting: Hematology

## 2018-02-26 NOTE — Progress Notes (Signed)
[name]  [phone] Fax:([phone]  Clinic Follow Up Note   Patient Care Team: [name], MD as PCP - General (Family Medicine) Erroll Luna, MD as Consulting Physician (General Surgery) Truitt Merle, MD as Consulting Physician (Hematology) Gery Pray, MD as Consulting Physician (Radiation Oncology) Gardenia Phlegm, NP as Nurse Practitioner (Hematology and Oncology) 03/01/2018  CHIEF COMPLAINTS:  Follow Up Left Breast Cancer   Oncology History   Cancer Staging Malignant neoplasm of upper-inner quadrant of left breast in female, estrogen receptor positive ([place]) Staging form: Breast, AJCC 8th Edition - Clinical stage from 02/10/2017: Stage Unknown (cTX, cN0, cM0, G1, ER: Positive, PR: Negative, HER2: Negative) - Signed by Truitt Merle, MD on 02/17/2017       Malignant neoplasm of upper-inner quadrant of left breast in female, estrogen receptor positive ([place])   01/23/2017 Mammogram    DIAGNOSTIC MAMMOGRAM AND US IMPRESSION: Small area of architectural distortion in the posterior upper left breast at approximately 11 o'clock. Tissue sampling is recommended. [place] of left breast and axilla was negative     02/10/2017 Receptors her2    Estrogen Receptor: 90%, POSITIVE, STRONG STAINING INTENSITY Progesterone Receptor: 0%, NEGATIVE HER2 NOT AMPLIFIED  Proliferation Marker Ki67: 2%     02/10/2017 Initial Biopsy    Breast, left, needle core biopsy, upper inner - INVASIVE DUCTAL CARCINOMA. GRADE 1    02/10/2017 Initial Diagnosis    Malignant neoplasm of upper-inner quadrant of left breast in female, estrogen receptor positive ([place])    03/24/2017 Surgery    Left lumpectomy: IDC, g1, 0.5cm, margins negative, 5 SLN negative, ER positive, PR/HER-2 negative, Ki-67 2%.      05/06/2017 - 06/04/2017 Radiation Therapy    (Dr. Sondra Come): 1. Left breast// 40.05 Gy in 15 fractions.  2. Boost // 10 Gy in 5 fractions.      05/2017 -  Anti-estrogen  oral therapy    Anastrozole daily    01/06/2018 Mammogram    01/06/2018 Mammogram IMPRESSION: No evidence of malignancy.      HISTORY OF PRESENTING ILLNESS: 02/18/17 [name] 57 y.o. female is here because of newly diagnosed left breast cancer.  She presents to the Breast clinic today with her husband.   In the past she was diagnosed with glaucoma. She had a ovarian pregnancy in 1985 but was still able to have 2 children afterward. She smoked several years ago but no longer. Her maternal aunt had breast cancer in her 58s. Her mother had lung caner from smoking.   Today she reports this lump was found by screening mammogram and she had yearly screening before that were normal. She denies any body change in weight, breast appearence, energy or appetite.  She takes vitamin Fish oil.   GYN HISTORY  Menarchal: 13 LMP: age 62 Contraceptive: 1977-2009 HRT: No GP: G5P3A2, miscarriage and ovarian pregnancy. 57 yo at first pregnancy. She has 1 daughter 1 son.   CURRENT THERAPY: Anastrozole started on 06/18/17, will temporarily hold it for 2 to 3 weeks, starting March 01, 2018, due to her fatigue  INTERIM HISTORY:  [name] presents today for routine follow up of her left breast cancer. Her 2019 mammogram was benign. She was last seen by me almost 6 months ago, and she has attended survivorship clinic in the interim.  Today, she is here alone at the clinic. She is ding well, but has a cold. No cough or chest discomfort. She denies having sick family members, but she  attended a seminar recently. She tried to exercise. She has mild hot flashes at night. She injured her left knee recently and experiences hip pain after long periods of sitting, but denies any other joint pain.  She denies mood changes.   She doesn't see her surgeon anymore. She sees her Ob/Gyn.   MEDICAL HISTORY:  Past Medical History:  Diagnosis Date  . Breast cancer, left ([place]) 01/2017  . Glaucoma    no  current med.  Marland Kitchen History of radiation therapy 05/06/17-06/04/17   left breast 40.05 Gy in 15 fractions, left breast boost 10 Gy in 5 fractions  . PONV (postoperative nausea and vomiting)     SURGICAL HISTORY: Past Surgical History:  Procedure Laterality Date  . BREAST BIOPSY    . BREAST LUMPECTOMY Left 02/2017   invasive ductal ca  . BREAST LUMPECTOMY WITH RADIOACTIVE SEED AND SENTINEL LYMPH NODE BIOPSY Left 03/24/2017   Procedure: BREAST PARTIAL MASTECTOMY WITH RADIOACTIVE SEED AND SENTINEL LYMPH NODE BIOPSY;  Surgeon: Erroll Luna, MD;  Location: [facility] [place];  Service: General;  Laterality: Left;  . CATARACT EXTRACTION W/ INTRAOCULAR LENS IMPLANT      SOCIAL HISTORY: Social History   Socioeconomic History  . Marital status: Married    Spouse name: Not on file  . Number of children: 3  . Years of education: Not on file  . Highest education level: Not on file  Occupational History  . Occupation: Engineer, maintenance (IT)  Social Needs  . Financial resource strain: Not on file  . Food insecurity:    Worry: Not on file    Inability: Not on file  . Transportation needs:    Medical: Not on file    Non-medical: Not on file  Tobacco Use  . Smoking status: Former Smoker    Last attempt to quit: 05/26/1983    Years since quitting: 34.7  . Smokeless tobacco: Never Used  Substance and Sexual Activity  . Alcohol use: Yes    Comment: occasionally  . Drug use: No  . Sexual activity: Not on file  Lifestyle  . Physical activity:    Days per week: Not on file    Minutes per session: Not on file  . Stress: Not on file  Relationships  . Social connections:    Talks on phone: Not on file    Gets together: Not on file    Attends religious service: Not on file    Active member of club or organization: Not on file    Attends meetings of clubs or organizations: Not on file    Relationship status: Not on file  . Intimate partner violence:    Fear of current or ex partner: Not on file     Emotionally abused: Not on file    Physically abused: Not on file    Forced sexual activity: Not on file  Other Topics Concern  . Not on file  Social History Narrative  . Not on file    FAMILY HISTORY: Family History  Problem Relation Age of Onset  . Lung cancer Mother   . Breast cancer Maternal Aunt     ALLERGIES:  is allergic to latex.  MEDICATIONS:  Current Outpatient Medications  Medication Sig Dispense Refill  . anastrozole (ARIMIDEX) 1 MG tablet Take 1 tablet (1 mg total) by mouth daily. 90 tablet 3  . calcium carbonate (CALCIUM 600) 600 MG TABS tablet Take 1 tablet (600 mg total) by mouth 2 (two) times daily with a meal. 60  tablet   . cholecalciferol (VITAMIN D) 1000 units tablet Take 1 tablet (1,000 Units total) by mouth daily.     No current facility-administered medications for this visit.    REVIEW OF SYSTEMS:   Constitutional: Denies fevers, chills or abnormal night sweats Eyes: Denies blurriness of vision, double vision or watery eyes Ears, nose, mouth, throat, and face: (+) nasal congestion  Respiratory: Denies cough, dyspnea or wheezes Cardiovascular: Denies palpitation, chest discomfort or lower extremity swelling Gastrointestinal:  Denies nausea, heartburn or change in bowel habits Skin: Denies abnormal skin rashes Lymphatics: Denies new lymphadenopathy or easy bruising Neurological:Denies numbness, tingling or new weaknesses Behavioral/Psych: Mood is stable, no new changes  MSK: (+) left knee pain dur to injury (+) hip pain All other systems were reviewed with the patient and are negative.  PHYSICAL EXAMINATION: ECOG PERFORMANCE STATUS: 0 - Asymptomatic  Vitals:   03/01/18 1028  BP: (!) 147/95  Pulse: 71  Resp: 18  Temp: 98.5 F (36.9 C)  SpO2: 100%   Filed Weights   03/01/18 1028  Weight: 206 lb (93.4 kg)    GENERAL:alert, no distress and comfortable SKIN: skin color, texture, turgor are normal, no rashes or significant lesions EYES:  normal, conjunctiva are pink and non-injected, sclera clear OROPHARYNX:no exudate, no erythema and lips, buccal mucosa, and tongue normal  NECK: supple, thyroid normal size, non-tender, without nodularity LYMPH:  no palpable lymphadenopathy in the cervical, axillary or inguinal LUNGS: clear to auscultation and percussion with normal breathing effort HEART: regular rate & rhythm and no murmurs and no lower extremity edema ABDOMEN:abdomen soft, non-tender and normal bowel sounds Musculoskeletal:no cyanosis of digits and no clubbing  PSYCH: alert & oriented x 3 with fluent speech NEURO: no focal motor/sensory deficits MSK: (+) middle-lower back pain Breast: Breast inspection showed them to be symmetrical with no nipple discharge. Palpation of the breasts and axilla revealed no obvious mass that I could appreciate.   LABORATORY DATA:  I have reviewed the data as listed CBC Latest Ref Rng & Units 03/01/2018 09/14/2017 02/18/2017  WBC 3.9 - 10.3 K/uL 4.7 6.0 6.0  Hemoglobin 11.6 - 15.9 g/dL 12.9 13.4 13.4  Hematocrit 34.8 - 46.6 % 38.0 41.1 40.1  Platelets 145 - 400 K/uL 141(L) 139(L) 163    CMP Latest Ref Rng & Units 03/01/2018 09/14/2017 02/18/2017  Glucose 70 - 99 mg/dL 94 99 95  BUN 6 - 20 mg/dL 11 16 14.0  Creatinine 0.44 - 1.00 mg/dL 0.81 0.80 0.8  Sodium 135 - 145 mmol/L 140 138 140  Potassium 3.5 - 5.1 mmol/L 4.3 4.2 4.3  Chloride 98 - 111 mmol/L 103 104 -  CO2 22 - 32 mmol/L '28 26 27  ' Calcium 8.9 - 10.3 mg/dL 9.7 10.1 9.6  Total Protein 6.5 - 8.1 g/dL 7.5 7.5 7.6  Total Bilirubin 0.3 - 1.2 mg/dL 0.4 0.6 0.64  Alkaline Phos 38 - 126 U/L 68 64 55  AST 15 - 41 U/L '20 19 20  ' ALT 0 - 44 U/L '19 21 19    ' PATHOLOGY   Diagnosis Breast, left, needle core biopsy, upper inner - INVASIVE DUCTAL CARCINOMA. - SEE COMMENT. Microscopic Comment There is a microscopic focus of grade I invasive ductal carcinoma as confirmed by lack of myoepithelial cells based on smooth muscle myosin,  calponin, and p63 stains. Dr Claudette Laws has reviewed the case and concurs with this interpretation. A full breast prognostic profile will be attempted and the results reported separately. The results were called  to [facility] on 02/11/17. (JBK:gt:ecj, 02/11/17) FLUORESCENCE IN-SITU HYBRIDIZATION Results: HER2 - NEGATIVE RATIO OF HER2/CEP17 SIGNALS 1.15 AVERAGE HER2 COPY NUMBER PER CELL 1.90 PROGNOSTIC INDICATORS Results: IMMUNOHISTOCHEMICAL AND MORPHOMETRIC ANALYSIS PERFORMED MANUALLY Estrogen Receptor: 90%, POSITIVE, STRONG STAINING INTENSITY Progesterone Receptor: 0%, NEGATIVE Proliferation Marker Ki67: 2%  RADIOGRAPHIC STUDIES: I have personally reviewed the radiological images as listed and agreed with the findings in the report. No results found.   01/06/2018 Mammogram IMPRESSION: No evidence of malignancy.  DEXA 07/08/17 ASSESSMENT: The BMD measured at Femur Neck Left is 0.790 g/cm2 with a T-score of -1.8.  ASSESSMENT & PLAN:  [name] is a 57 y.o. female with  A history of glaucoma.    1. 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 She had lumpectomy and sentinel lymph node biopsy, which showed a 5 mm tumor, margins were negative, nodes were negative. -Given the small size of tumor, low-grade, she does not need adjuvant chemotherapy. -She completed RT with Dr. Sondra Come on 06/04/17 -She started adjuvant Anastrozole in Jan 2019, tolerating well overall, but she reports significant fatigue, and sleeps more than usual.  She is not sure if it is related to anastrozole.  She otherwise tolerating anastrozole, with minimal hot flash or joint discomfort. -I discussed stopping Anastrozole for a few weeks to see if her energy level improves.  If no change, she will  restart anastrozole.  We also discussed switching anastrozole to other AI or tamoxifen, if the fatigue is related to anastrozole.  She agrees with the plan.    -Mammogram on 01/06/2018 was benign.  Exam today was unremarkable, no clinical concern for recurrence. -She attends Survivorship clinic.   -Continue breast cancer surveillance. -She will f/u with her Ob/Gyn. She knows to call if she needs anything.   -F/u in 6 months.  Due to her high deductible, I plan to see her once a year after next visit.   2. Osteopenia -She has not previously had a bone density scan. -I previously discussed with her again today that I would like for her to have this performed before we see her again.  -DEXA from 07/08/17 revealed a T score of -1.8 at the left femur neck  - I discussed her options to increase her bone strength with pill, injection or infusion. She is not at a high risk for Fracture in the next 10 years and she had low risk breast cancer so I do not think she needs to consider these options right now. Will repeat DEXA in 2 years -I discussed I can change her to Tamoxifen if she progresses Osteoporosis.  -I recommended her to take Ca and Vit D as well as being active and adding some weight bearing exercise for now  3. URI -She has symptoms of nasal congestion. Denies fever, cough, or CP.  PLAN:  -She will stop anastrozole for 2 to 3 weeks, and call me back, to see if her fatigue improves.  If not she will restart anastrozole, if yes, I may switch her to letrozole or exemestane.   -F/u in 6 months with labs.    No orders of the defined types were placed in this encounter.   All questions were answered. The patient knows to call the clinic with any problems, questions or concerns. I spent 15 minutes counseling the  patient face to face. The total time spent in the appointment was 20 minutes and more than 50% was on counseling.  Dierdre Searles Dweik am acting as scribe for Dr. Truitt Merle.  I have  reviewed the above documentation for accuracy and completeness, and I agree with the above.     Truitt Merle, MD 03/01/2018 11:20 AM

## 2018-03-01 ENCOUNTER — Encounter: Payer: Self-pay | Admitting: Hematology

## 2018-03-01 ENCOUNTER — Inpatient Hospital Stay (HOSPITAL_BASED_OUTPATIENT_CLINIC_OR_DEPARTMENT_OTHER): Payer: BLUE CROSS/BLUE SHIELD | Admitting: Hematology

## 2018-03-01 ENCOUNTER — Inpatient Hospital Stay: Payer: BLUE CROSS/BLUE SHIELD | Attending: Adult Health

## 2018-03-01 VITALS — BP 147/95 | HR 71 | Temp 98.5°F | Resp 18 | Ht 64.5 in | Wt 206.0 lb

## 2018-03-01 DIAGNOSIS — Z87891 Personal history of nicotine dependence: Secondary | ICD-10-CM | POA: Diagnosis not present

## 2018-03-01 DIAGNOSIS — Z79811 Long term (current) use of aromatase inhibitors: Secondary | ICD-10-CM | POA: Insufficient documentation

## 2018-03-01 DIAGNOSIS — J069 Acute upper respiratory infection, unspecified: Secondary | ICD-10-CM | POA: Insufficient documentation

## 2018-03-01 DIAGNOSIS — M8580 Other specified disorders of bone density and structure, unspecified site: Secondary | ICD-10-CM | POA: Diagnosis not present

## 2018-03-01 DIAGNOSIS — Z923 Personal history of irradiation: Secondary | ICD-10-CM | POA: Diagnosis not present

## 2018-03-01 DIAGNOSIS — Z170 Estrogen receptor positive status [ER+]: Secondary | ICD-10-CM

## 2018-03-01 DIAGNOSIS — C50212 Malignant neoplasm of upper-inner quadrant of left female breast: Secondary | ICD-10-CM

## 2018-03-01 LAB — CBC WITH DIFFERENTIAL (CANCER CENTER ONLY)
BASOS ABS: 0 10*3/uL (ref 0.0–0.1)
Basophils Relative: 1 %
Eosinophils Absolute: 0.1 10*3/uL (ref 0.0–0.5)
Eosinophils Relative: 3 %
HCT: 38 % (ref 34.8–46.6)
HEMOGLOBIN: 12.9 g/dL (ref 11.6–15.9)
LYMPHS ABS: 1.2 10*3/uL (ref 0.9–3.3)
LYMPHS PCT: 26 %
MCH: 30.8 pg (ref 25.1–34.0)
MCHC: 33.9 g/dL (ref 31.5–36.0)
MCV: 90.8 fL (ref 79.5–101.0)
Monocytes Absolute: 0.5 10*3/uL (ref 0.1–0.9)
Monocytes Relative: 10 %
NEUTROS PCT: 60 %
Neutro Abs: 2.9 10*3/uL (ref 1.5–6.5)
Platelet Count: 141 10*3/uL — ABNORMAL LOW (ref 145–400)
RBC: 4.18 MIL/uL (ref 3.70–5.45)
RDW: 12.5 % (ref 11.2–14.5)
WBC: 4.7 10*3/uL (ref 3.9–10.3)

## 2018-03-01 LAB — CMP (CANCER CENTER ONLY)
ALT: 19 U/L (ref 0–44)
ANION GAP: 9 (ref 5–15)
AST: 20 U/L (ref 15–41)
Albumin: 4 g/dL (ref 3.5–5.0)
Alkaline Phosphatase: 68 U/L (ref 38–126)
BUN: 11 mg/dL (ref 6–20)
CHLORIDE: 103 mmol/L (ref 98–111)
CO2: 28 mmol/L (ref 22–32)
Calcium: 9.7 mg/dL (ref 8.9–10.3)
Creatinine: 0.81 mg/dL (ref 0.44–1.00)
Glucose, Bld: 94 mg/dL (ref 70–99)
POTASSIUM: 4.3 mmol/L (ref 3.5–5.1)
Sodium: 140 mmol/L (ref 135–145)
TOTAL PROTEIN: 7.5 g/dL (ref 6.5–8.1)
Total Bilirubin: 0.4 mg/dL (ref 0.3–1.2)

## 2018-03-02 ENCOUNTER — Telehealth: Payer: Self-pay | Admitting: Hematology

## 2018-03-02 NOTE — Telephone Encounter (Signed)
Appts scheduled letter / calendar mailed unable to reach patient by phone per 10/7 los

## 2018-03-03 ENCOUNTER — Telehealth: Payer: Self-pay | Admitting: Hematology

## 2018-03-03 NOTE — Telephone Encounter (Signed)
Appts scheduled letter/calendar mailed and I spoke with patient per 10/7 los

## 2018-08-26 ENCOUNTER — Telehealth: Payer: Self-pay | Admitting: Hematology

## 2018-08-26 NOTE — Telephone Encounter (Signed)
R/s appt per 3/31 sch message - unable to reach patient - left message with new appt date and time

## 2018-09-01 ENCOUNTER — Ambulatory Visit: Payer: BLUE CROSS/BLUE SHIELD | Admitting: Hematology

## 2018-09-01 ENCOUNTER — Other Ambulatory Visit: Payer: BLUE CROSS/BLUE SHIELD

## 2018-09-07 ENCOUNTER — Telehealth: Payer: Self-pay | Admitting: Hematology

## 2018-09-07 NOTE — Telephone Encounter (Signed)
Called patient regarding upcoming Webex meeting, patient does not have access and would prefer it to be a telephone visit.

## 2018-09-08 NOTE — Progress Notes (Signed)
[place]   Telephone:(336) [phone] Fax:(336) [phone]   Clinic Follow up Note   Patient Care Team: [name], MD as PCP - General (Family Medicine) Erroll Luna, MD as Consulting Physician (General Surgery) Truitt Merle, MD as Consulting Physician (Hematology) Gery Pray, MD as Consulting Physician (Radiation Oncology) Gardenia Phlegm, NP as Nurse Practitioner (Hematology and Oncology)   I connected with [name] on 09/09/2018 at  2:45 PM EDT by telephone visit and verified that I am speaking with the correct person using two identifiers.  I discussed the limitations, risks, security and privacy concerns of performing an evaluation and management service by telephone and the availability of in person appointments. I also discussed with the patient that there may be a patient responsible charge related to this service. The patient expressed understanding and agreed to proceed.   Patient's location:  Her home  Provider's location:  My office   CHIEF COMPLAINT: Follow Up Left Breast Cancer   SUMMARY OF ONCOLOGIC HISTORY: Oncology History   Cancer Staging Malignant neoplasm of upper-inner quadrant of left breast in female, estrogen receptor positive ([place]) Staging form: Breast, AJCC 8th Edition - Clinical stage from 02/10/2017: Stage Unknown (cTX, cN0, cM0, G1, ER: Positive, PR: Negative, HER2: Negative) - Signed by Truitt Merle, MD on 02/17/2017       Malignant neoplasm of upper-inner quadrant of left breast in female, estrogen receptor positive ([place])   01/23/2017 Mammogram    DIAGNOSTIC MAMMOGRAM AND US IMPRESSION: Small area of architectural distortion in the posterior upper left breast at approximately 11 o'clock. Tissue sampling is recommended. [place] of left breast and axilla was negative     02/10/2017 Receptors her2    Estrogen Receptor: 90%, POSITIVE, STRONG STAINING INTENSITY Progesterone Receptor: 0%, NEGATIVE HER2 NOT AMPLIFIED   Proliferation Marker Ki67: 2%     02/10/2017 Initial Biopsy    Breast, left, needle core biopsy, upper inner - INVASIVE DUCTAL CARCINOMA. GRADE 1    02/10/2017 Initial Diagnosis    Malignant neoplasm of upper-inner quadrant of left breast in female, estrogen receptor positive ([place])    03/24/2017 Surgery    Left lumpectomy: IDC, g1, 0.5cm, margins negative, 5 SLN negative, ER positive, PR/HER-2 negative, Ki-67 2%.      05/06/2017 - 06/04/2017 Radiation Therapy    (Dr. Sondra Come): 1. Left breast// 40.05 Gy in 15 fractions.  2. Boost // 10 Gy in 5 fractions.      05/2017 -  Anti-estrogen oral therapy    Anastrozole daily    01/06/2018 Mammogram    01/06/2018 Mammogram IMPRESSION: No evidence of malignancy.      CURRENT THERAPY:  Anastrozole started on 06/18/17, will temporarily hold it for 3-4 weeks, starting March 01, 2018, due to her fatigue, then restarted.   INTERVAL HISTORY:  [name] is here for a follow up of left breast cancer. She was last seen by me 6 months ago. She was able to identify herself by birth date.  She notes she is doing well. She denies any changes. She notes her fatigue did resolve which she attributed to radiation. She notes she restarted anastrozole after 3-4 weeks off. She denies join pain or stiffness.  She will recheck her BP at her next doctor's visit. She denies HA or chest discomfort.    REVIEW OF SYSTEMS:   Constitutional: Denies fevers, chills or abnormal weight loss Eyes: Denies blurriness of vision Ears, nose, mouth, throat, and face: Denies mucositis or sore throat Respiratory:  Denies cough, dyspnea or wheezes Cardiovascular: Denies palpitation, chest discomfort or lower extremity swelling Gastrointestinal:  Denies nausea, heartburn or change in bowel habits Skin: Denies abnormal skin rashes Lymphatics: Denies new lymphadenopathy or easy bruising Neurological:Denies numbness, tingling or new weaknesses Behavioral/Psych: Mood is  stable, no new changes  All other systems were reviewed with the patient and are negative.  MEDICAL HISTORY:  Past Medical History:  Diagnosis Date  . Breast cancer, left ([place]) 01/2017  . Glaucoma    no current med.  Marland Kitchen History of radiation therapy 05/06/17-06/04/17   left breast 40.05 Gy in 15 fractions, left breast boost 10 Gy in 5 fractions  . PONV (postoperative nausea and vomiting)     SURGICAL HISTORY: Past Surgical History:  Procedure Laterality Date  . BREAST BIOPSY    . BREAST LUMPECTOMY Left 02/2017   invasive ductal ca  . BREAST LUMPECTOMY WITH RADIOACTIVE SEED AND SENTINEL LYMPH NODE BIOPSY Left 03/24/2017   Procedure: BREAST PARTIAL MASTECTOMY WITH RADIOACTIVE SEED AND SENTINEL LYMPH NODE BIOPSY;  Surgeon: Erroll Luna, MD;  Location: [facility];  Service: General;  Laterality: Left;  . CATARACT EXTRACTION W/ INTRAOCULAR LENS IMPLANT      I have reviewed the social history and family history with the patient and they are unchanged from previous note.  ALLERGIES:  is allergic to latex.  MEDICATIONS:  Current Outpatient Medications  Medication Sig Dispense Refill  . anastrozole (ARIMIDEX) 1 MG tablet Take 1 tablet (1 mg total) by mouth daily. 90 tablet 3  . calcium carbonate (CALCIUM 600) 600 MG TABS tablet Take 1 tablet (600 mg total) by mouth 2 (two) times daily with a meal. 60 tablet   . cholecalciferol (VITAMIN D) 1000 units tablet Take 1 tablet (1,000 Units total) by mouth daily.     No current facility-administered medications for this visit.     PHYSICAL EXAMINATION: ECOG PERFORMANCE STATUS: 0 - Asymptomatic  ** No vitals taken today, Exam not performed today **  LABORATORY DATA:  I have reviewed the data as listed CBC Latest Ref Rng & Units 03/01/2018 09/14/2017 02/18/2017  WBC 3.9 - 10.3 K/uL 4.7 6.0 6.0  Hemoglobin 11.6 - 15.9 g/dL 12.9 13.4 13.4  Hematocrit 34.8 - 46.6 % 38.0 41.1 40.1  Platelets 145 - 400 K/uL 141(L) 139(L) 163      CMP Latest Ref Rng & Units 03/01/2018 09/14/2017 02/18/2017  Glucose 70 - 99 mg/dL 94 99 95  BUN 6 - 20 mg/dL 11 16 14.0  Creatinine 0.44 - 1.00 mg/dL 0.81 0.80 0.8  Sodium 135 - 145 mmol/L 140 138 140  Potassium 3.5 - 5.1 mmol/L 4.3 4.2 4.3  Chloride 98 - 111 mmol/L 103 104 -  CO2 22 - 32 mmol/L '28 26 27  ' Calcium 8.9 - 10.3 mg/dL 9.7 10.1 9.6  Total Protein 6.5 - 8.1 g/dL 7.5 7.5 7.6  Total Bilirubin 0.3 - 1.2 mg/dL 0.4 0.6 0.64  Alkaline Phos 38 - 126 U/L 68 64 55  AST 15 - 41 U/L '20 19 20  ' ALT 0 - 44 U/L '19 21 19      ' RADIOGRAPHIC STUDIES: I have personally reviewed the radiological images as listed and agreed with the findings in the report. No results found.   ASSESSMENT & PLAN:  [name] is a 58 y.o. female with   1. 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 She is s/p left lumpectomy and adjuvant radiation.  -Given the small size of tumor, low-grade, she does not need adjuvant chemotherapy. -She started antiestrogen therapy with Anastrozole in 05/2017. She held for 3-4 weeks in October 2019 due to fatigue and restarted after. She has been tolerating well. She now thinks her previous fatigue was related to radiation  -She is clinically doing well. Her 12/2017 mammogram was unremarkable. There is no clinical concern for recurrence. -Continue surveillance, next mammogram in 12/2018. -Continue Anastrozole  -F/u with me in 3-4 months. Continue to follow up with her PCP.   2. Osteopenia -DEXA from 07/08/17 revealed a T score of -1.8 at the left femur neck  - I discussed her options of biphosphonate or Prolia injection improve her bone density.  She does not have very high risk for fracture, okay to hold it for now. -Continue taking oral Ca and Vit D as well as being active and adding some weight bearing exercise for  now -Next DEXA in 06/2019   PLAN:  -Continue anastrozole  -She is clinically doing well  -lab and f/u in 3-4 months  -Mammogram in 12/2018   No problem-specific Assessment & Plan notes found for this encounter.   Orders Placed This Encounter  Procedures  . MM DIAG BREAST TOMO BILATERAL    Standing Status:   Future    Standing Expiration Date:   09/09/2019    Order Specific Question:   Reason for Exam (SYMPTOM  OR DIAGNOSIS REQUIRED)    Answer:   screening    Order Specific Question:   Is the patient pregnant?    Answer:   No    Order Specific Question:   Preferred imaging location?    Answer:   [facility]   I discussed the assessment and treatment plan with the patient. The patient was provided an opportunity to ask questions and all were answered. The patient agreed with the plan and demonstrated an understanding of the instructions.  The patient was advised to call back or seek an in-person evaluation if the symptoms worsen or if the condition fails to improve as anticipated.   I provided 10 minutes of non face-to-face telephone visit time during this encounter, and > 50% was spent counseling as documented under my assessment & plan.    Truitt Merle, MD 09/09/2018   I, Joslyn Devon, am acting as scribe for Truitt Merle, MD.   I have reviewed the above documentation for accuracy and completeness, and I agree with the above.

## 2018-09-09 ENCOUNTER — Inpatient Hospital Stay: Payer: BLUE CROSS/BLUE SHIELD | Attending: Hematology | Admitting: Hematology

## 2018-09-09 DIAGNOSIS — Z79811 Long term (current) use of aromatase inhibitors: Secondary | ICD-10-CM

## 2018-09-09 DIAGNOSIS — C50212 Malignant neoplasm of upper-inner quadrant of left female breast: Secondary | ICD-10-CM

## 2018-09-09 DIAGNOSIS — M8588 Other specified disorders of bone density and structure, other site: Secondary | ICD-10-CM | POA: Diagnosis not present

## 2018-09-09 DIAGNOSIS — Z170 Estrogen receptor positive status [ER+]: Secondary | ICD-10-CM

## 2018-09-09 MED ORDER — ANASTROZOLE 1 MG PO TABS: 1.0000 mg | ORAL_TABLET | Freq: Every day | ORAL | 3 refills | Status: DC

## 2018-09-10 ENCOUNTER — Ambulatory Visit: Payer: BLUE CROSS/BLUE SHIELD | Admitting: Hematology

## 2018-09-10 ENCOUNTER — Other Ambulatory Visit: Payer: BLUE CROSS/BLUE SHIELD

## 2018-09-10 ENCOUNTER — Encounter: Payer: Self-pay | Admitting: Hematology

## 2018-09-10 ENCOUNTER — Telehealth: Payer: Self-pay | Admitting: Hematology

## 2018-09-10 NOTE — Telephone Encounter (Signed)
Scheduled appt per 4/16 los. °

## 2018-12-16 NOTE — Progress Notes (Signed)
[place]   Telephone:(336) [phone] Fax:(336) [phone]   Clinic Follow up Note   Patient Care Team: [name], MD as PCP - General (Family Medicine) Erroll Luna, MD as Consulting Physician (General Surgery) Truitt Merle, MD as Consulting Physician (Hematology) Gery Pray, MD as Consulting Physician (Radiation Oncology) Gardenia Phlegm, NP as Nurse Practitioner (Hematology and Oncology)  Date of Service:  12/20/2018  CHIEF COMPLAINT: Follow Up Left Breast Cancer  SUMMARY OF ONCOLOGIC HISTORY: Oncology History Overview Note  Cancer Staging Malignant neoplasm of upper-inner quadrant of left breast in female, estrogen receptor positive ([name]) Staging form: Breast, AJCC 8th Edition - Clinical stage from 02/10/2017: Stage Unknown (cTX, cN0, cM0, G1, ER: Positive, PR: Negative, HER2: Negative) - Signed by Truitt Merle, MD on 02/17/2017     Malignant neoplasm of upper-inner quadrant of left breast in female, estrogen receptor positive ([name])  01/23/2017 Mammogram   DIAGNOSTIC MAMMOGRAM AND US IMPRESSION: Small area of architectural distortion in the posterior upper left breast at approximately 11 o'clock. Tissue sampling is recommended. [place] of left breast and axilla was negative    02/10/2017 Receptors her2   Estrogen Receptor: 90%, POSITIVE, STRONG STAINING INTENSITY Progesterone Receptor: 0%, NEGATIVE HER2 NOT AMPLIFIED  Proliferation Marker Ki67: 2%    02/10/2017 Initial Biopsy   Breast, left, needle core biopsy, upper inner - INVASIVE DUCTAL CARCINOMA. GRADE 1   02/10/2017 Initial Diagnosis   Malignant neoplasm of upper-inner quadrant of left breast in female, estrogen receptor positive ([name])   03/24/2017 Surgery   Left lumpectomy: IDC, g1, 0.5cm, margins negative, 5 SLN negative, ER positive, PR/HER-2 negative, Ki-67 2%.     05/06/2017 - 06/04/2017 Radiation Therapy   (Dr. Sondra Come): 1. Left breast// 40.05 Gy in 15 fractions.  2. Boost // 10 Gy in  5 fractions.     05/2017 -  Anti-estrogen oral therapy   Anastrozole daily   01/06/2018 Mammogram   01/06/2018 Mammogram IMPRESSION: No evidence of malignancy.      CURRENT THERAPY:  Anastrozole started on 06/18/17, willtemporarily hold it for 3-4 weeks, starting March 01, 2018, due to her fatigue, then restarted.   INTERVAL HISTORY:  [name] is here for a follow up left breast cancer. She presents to the clinic alone. She notes she is doing well except possible gallbladder issues. She has had 2 incidental in the last 4 months with Mid to right upper abdominal pain after eating. She had ate some rich foods she normally didn't eat. She thought it was a heart attack but pain went away. She notes the pain lasted 2 hours and resolved on it's own.  She wondered what tests she should do with her PCP given she is on anastrozole. She is taking it with no issues. She feels her BP has been elevated occasionally which has never been high before.    REVIEW OF SYSTEMS:   Constitutional: Denies fevers, chills or abnormal weight loss Eyes: Denies blurriness of vision Ears, nose, mouth, throat, and face: Denies mucositis or sore throat Respiratory: Denies cough, dyspnea or wheezes Cardiovascular: Denies palpitation, chest discomfort or lower extremity swelling Gastrointestinal:  Denies nausea, heartburn or change in bowel habits  Skin: Denies abnormal skin rashes Lymphatics: Denies new lymphadenopathy or easy bruising Neurological:Denies numbness, tingling or new weaknesses Behavioral/Psych: Mood is stable, no new changes  All other systems were reviewed with the patient and are negative.  MEDICAL HISTORY:  Past Medical History:  Diagnosis Date  . Breast cancer, left ([place])  01/2017  . Glaucoma    no current med.  Marland Kitchen History of radiation therapy 05/06/17-06/04/17   left breast 40.05 Gy in 15 fractions, left breast boost 10 Gy in 5 fractions  . PONV (postoperative nausea and  vomiting)     SURGICAL HISTORY: Past Surgical History:  Procedure Laterality Date  . BREAST BIOPSY    . BREAST LUMPECTOMY Left 02/2017   invasive ductal ca  . BREAST LUMPECTOMY WITH RADIOACTIVE SEED AND SENTINEL LYMPH NODE BIOPSY Left 03/24/2017   Procedure: BREAST PARTIAL MASTECTOMY WITH RADIOACTIVE SEED AND SENTINEL LYMPH NODE BIOPSY;  Surgeon: Erroll Luna, MD;  Location: [facility];  Service: General;  Laterality: Left;  . CATARACT EXTRACTION W/ INTRAOCULAR LENS IMPLANT      I have reviewed the social history and family history with the patient and they are unchanged from previous note.  ALLERGIES:  is allergic to latex.  MEDICATIONS:  Current Outpatient Medications  Medication Sig Dispense Refill  . anastrozole (ARIMIDEX) 1 MG tablet Take 1 tablet (1 mg total) by mouth daily. 90 tablet 3  . calcium carbonate (CALCIUM 600) 600 MG TABS tablet Take 1 tablet (600 mg total) by mouth 2 (two) times daily with a meal. 60 tablet   . cholecalciferol (VITAMIN D) 1000 units tablet Take 1 tablet (1,000 Units total) by mouth daily.     No current facility-administered medications for this visit.     PHYSICAL EXAMINATION: ECOG PERFORMANCE STATUS: 0 - Asymptomatic  Vitals:   12/20/18 1102  BP: (!) 129/92  Pulse: 63  Resp: 18  Temp: 98.8 F (37.1 C)  SpO2: 100%   Filed Weights   12/20/18 1102  Weight: 190 lb 14.4 oz (86.6 kg)    GENERAL:alert, no distress and comfortable SKIN: skin color, texture, turgor are normal, no rashes or significant lesions EYES: normal, Conjunctiva are pink and non-injected, sclera clear  NECK: supple, thyroid normal size, non-tender, without nodularity LYMPH:  no palpable lymphadenopathy in the cervical, axillary  LUNGS: clear to auscultation and percussion with normal breathing effort HEART: regular rate & rhythm and no murmurs and no lower extremity edema ABDOMEN:abdomen soft, non-tender and normal bowel sounds   Musculoskeletal:no cyanosis of digits and no clubbing  NEURO: alert & oriented x 3 with fluent speech, no focal motor/sensory deficits BREAST: S/p left lumpectomy: Surgical incision healed well. No palpable mass, nodules or adenopathy bilaterally. Breast exam benign.   LABORATORY DATA:  I have reviewed the data as listed CBC Latest Ref Rng & Units 12/20/2018 03/01/2018 09/14/2017  WBC 4.0 - 10.5 K/uL 5.4 4.7 6.0  Hemoglobin 12.0 - 15.0 g/dL 13.4 12.9 13.4  Hematocrit 36.0 - 46.0 % 41.0 38.0 41.1  Platelets 150 - 400 K/uL 153 141(L) 139(L)     CMP Latest Ref Rng & Units 12/20/2018 03/01/2018 09/14/2017  Glucose 70 - 99 mg/dL 98 94 99  BUN 6 - 20 mg/dL '17 11 16  ' Creatinine 0.44 - 1.00 mg/dL 0.79 0.81 0.80  Sodium 135 - 145 mmol/L 137 140 138  Potassium 3.5 - 5.1 mmol/L 4.2 4.3 4.2  Chloride 98 - 111 mmol/L 103 103 104  CO2 22 - 32 mmol/L '24 28 26  ' Calcium 8.9 - 10.3 mg/dL 9.7 9.7 10.1  Total Protein 6.5 - 8.1 g/dL 7.5 7.5 7.5  Total Bilirubin 0.3 - 1.2 mg/dL 0.5 0.4 0.6  Alkaline Phos 38 - 126 U/L 61 68 64  AST 15 - 41 U/L '25 20 19  ' ALT 0 - 44  U/L 41 19 21      RADIOGRAPHIC STUDIES: I have personally reviewed the radiological images as listed and agreed with the findings in the report. No results found.   ASSESSMENT & PLAN:  [name] is a 58 y.o. female with   1. 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 She is s/p left lumpectomy and adjuvant radiation.  -Given the small size of tumor, low-grade, she does not need adjuvant chemotherapy. -She started antiestrogen therapy with Anastrozole in 05/2017. She held for 3-4 weeks in October 2019 due to fatigue and restarted after. She has been tolerating well. She now thinks her previous fatigue was related to radiation.  -I encouraged her to check her cholesterol, A1c and BP  with her PCP regularly. She has been working on losing weight and she is successful so far. I encouraged her to continue healthy diet and exercise.  -She is clinically doing well. Lab reviewed, her CBC and CMP are within normal limits. Her physical exam was unremarkable. There is no clinical concern for recurrence. -Continue surveillance, next mammogram on 01/11/19  -Continue Anastrozole  -Virtual visit in 6 months and f/u in 12 months   2. Osteopenia -DEXA from 07/08/17 revealed a T score of -1.8 at the left femur neck  -I discussed her options of bisphosphonate or Prolia injection improve her bone density. She does not have very high risk for fracture, okay to hold it for now. -Continue taking oral Ca and Vit D as well as being active and adding some weight bearing exercise for now -Next DEXA in with Mammogram in 12/2019  3. Mid to right upper abdominal pain -occurred twice postprandial and only lasted 2 hours in the last 4 months  -No tenderness on exam today (12/20/18) -Based on her symptoms this is possibly related to her gallbladder. I recommend she have US abdomen with her PCP, she plans to f/u with them soon.   4. BP elevated  -Has been mildly elevated lately.  -BP at 129/92 (12/20/18). I encouraged her to continue to lose weight, decrease salt intake and walk more.    PLAN:  -She is clinically doing well  -Copy my note to her PCP about her A1c, cholesterol and BP  -Continue anastrozole  -Virtual visit in 6 months  -Lab and f/u in 12 months    No problem-specific Assessment & Plan notes found for this encounter.   No orders of the defined types were placed in this encounter.  All questions were answered. The patient knows to call the clinic with any problems, questions or concerns. No barriers to learning was detected. I spent 15 minutes counseling the patient face to face. The total time spent in the appointment was 20 minutes and more than 50% was on counseling and review  of test results     Truitt Merle, MD 12/20/2018   I, Joslyn Devon, am acting as scribe for Truitt Merle, MD.   I have reviewed the above documentation for accuracy and completeness, and I agree with the above.

## 2018-12-20 ENCOUNTER — Inpatient Hospital Stay (HOSPITAL_BASED_OUTPATIENT_CLINIC_OR_DEPARTMENT_OTHER): Payer: BC Managed Care – PPO | Admitting: Hematology

## 2018-12-20 ENCOUNTER — Encounter: Payer: Self-pay | Admitting: Hematology

## 2018-12-20 ENCOUNTER — Telehealth: Payer: Self-pay | Admitting: Hematology

## 2018-12-20 ENCOUNTER — Other Ambulatory Visit: Payer: Self-pay

## 2018-12-20 ENCOUNTER — Inpatient Hospital Stay: Payer: BC Managed Care – PPO | Attending: Hematology

## 2018-12-20 VITALS — BP 129/92 | HR 63 | Temp 98.8°F | Resp 18 | Ht 64.5 in | Wt 190.9 lb

## 2018-12-20 DIAGNOSIS — M8580 Other specified disorders of bone density and structure, unspecified site: Secondary | ICD-10-CM

## 2018-12-20 DIAGNOSIS — Z923 Personal history of irradiation: Secondary | ICD-10-CM | POA: Diagnosis not present

## 2018-12-20 DIAGNOSIS — R030 Elevated blood-pressure reading, without diagnosis of hypertension: Secondary | ICD-10-CM | POA: Insufficient documentation

## 2018-12-20 DIAGNOSIS — R1011 Right upper quadrant pain: Secondary | ICD-10-CM | POA: Diagnosis not present

## 2018-12-20 DIAGNOSIS — R5383 Other fatigue: Secondary | ICD-10-CM | POA: Diagnosis not present

## 2018-12-20 DIAGNOSIS — Z170 Estrogen receptor positive status [ER+]: Secondary | ICD-10-CM

## 2018-12-20 DIAGNOSIS — Z79811 Long term (current) use of aromatase inhibitors: Secondary | ICD-10-CM

## 2018-12-20 DIAGNOSIS — Z79899 Other long term (current) drug therapy: Secondary | ICD-10-CM | POA: Insufficient documentation

## 2018-12-20 DIAGNOSIS — H409 Unspecified glaucoma: Secondary | ICD-10-CM | POA: Insufficient documentation

## 2018-12-20 DIAGNOSIS — C50212 Malignant neoplasm of upper-inner quadrant of left female breast: Secondary | ICD-10-CM

## 2018-12-20 LAB — CMP (CANCER CENTER ONLY)
ALT: 41 U/L (ref 0–44)
AST: 25 U/L (ref 15–41)
Albumin: 4.2 g/dL (ref 3.5–5.0)
Alkaline Phosphatase: 61 U/L (ref 38–126)
Anion gap: 10 (ref 5–15)
BUN: 17 mg/dL (ref 6–20)
CO2: 24 mmol/L (ref 22–32)
Calcium: 9.7 mg/dL (ref 8.9–10.3)
Chloride: 103 mmol/L (ref 98–111)
Creatinine: 0.79 mg/dL (ref 0.44–1.00)
GFR, Est AFR Am: >60 mL/min (ref >60)
GFR, Estimated: >60 mL/min (ref >60)
Glucose, Bld: 98 mg/dL (ref 70–99)
Potassium: 4.2 mmol/L (ref 3.5–5.1)
Sodium: 137 mmol/L (ref 135–145)
Total Bilirubin: 0.5 mg/dL (ref 0.3–1.2)
Total Protein: 7.5 g/dL (ref 6.5–8.1)

## 2018-12-20 LAB — CBC WITH DIFFERENTIAL (CANCER CENTER ONLY)
Abs Immature Granulocytes: 0.01 10*3/uL (ref 0.00–0.07)
Basophils Absolute: 0.1 10*3/uL (ref 0.0–0.1)
Basophils Relative: 1 %
Eosinophils Absolute: 0.2 10*3/uL (ref 0.0–0.5)
Eosinophils Relative: 3 %
HCT: 41 % (ref 36.0–46.0)
Hemoglobin: 13.4 g/dL (ref 12.0–15.0)
Immature Granulocytes: 0 %
Lymphocytes Relative: 30 %
Lymphs Abs: 1.6 10*3/uL (ref 0.7–4.0)
MCH: 29.7 pg (ref 26.0–34.0)
MCHC: 32.7 g/dL (ref 30.0–36.0)
MCV: 90.9 fL (ref 80.0–100.0)
Monocytes Absolute: 0.5 10*3/uL (ref 0.1–1.0)
Monocytes Relative: 9 %
Neutro Abs: 3.1 10*3/uL (ref 1.7–7.7)
Neutrophils Relative %: 57 %
Platelet Count: 153 10*3/uL (ref 150–400)
RBC: 4.51 MIL/uL (ref 3.87–5.11)
RDW: 12.2 % (ref 11.5–15.5)
WBC Count: 5.4 10*3/uL (ref 4.0–10.5)
nRBC: 0 % (ref 0.0–0.2)

## 2018-12-20 NOTE — Telephone Encounter (Signed)
Scheduled appt per 7/27 los.  Patient only wanted to schedule her 6 month phone visit.  She stated she will call to schedule her one year lab and f/u.  Patient aware of her 6 month appt date and time. (did not schedule lab and f/u per patient request)

## 2019-01-11 ENCOUNTER — Ambulatory Visit
Admission: RE | Admit: 2019-01-11 | Discharge: 2019-01-11 | Disposition: A | Discharge status: Home / Self Care | Payer: BC Managed Care – PPO | Source: Ambulatory Visit | Attending: Hematology | Admitting: Hematology

## 2019-01-11 ENCOUNTER — Other Ambulatory Visit: Payer: Self-pay

## 2019-01-11 DIAGNOSIS — C50212 Malignant neoplasm of upper-inner quadrant of left female breast: Secondary | ICD-10-CM

## 2019-06-15 NOTE — Progress Notes (Signed)
[place]   Telephone:(336) [phone] Fax:(336) [phone]   Clinic Follow up Note   Patient Care Team: [name], MD as PCP - General (Family Medicine) Erroll Luna, MD as Consulting Physician (General Surgery) Truitt Merle, MD as Consulting Physician (Hematology) Gery Pray, MD as Consulting Physician (Radiation Oncology) Gardenia Phlegm, NP as Nurse Practitioner (Hematology and Oncology)   I connected with [name] on 06/22/2019 at 11:00 AM EST by telephone visit and verified that I am speaking with the correct person using two identifiers.  I discussed the limitations, risks, security and privacy concerns of performing an evaluation and management service by telephone and the availability of in person appointments. I also discussed with the patient that there may be a patient responsible charge related to this service. The patient expressed understanding and agreed to proceed.   Patient's location:  Her home  Provider's location:  My Office   CHIEF COMPLAINT: Follow Up Left Breast Cancer  SUMMARY OF ONCOLOGIC HISTORY: Oncology History Overview Note  Cancer Staging Malignant neoplasm of upper-inner quadrant of left breast in female, estrogen receptor positive ([place]) Staging form: Breast, AJCC 8th Edition - Clinical stage from 02/10/2017: Stage Unknown (cTX, cN0, cM0, G1, ER: Positive, PR: Negative, HER2: Negative) - Signed by Truitt Merle, MD on 02/17/2017     Malignant neoplasm of upper-inner quadrant of left breast in female, estrogen receptor positive ([place])  01/23/2017 Mammogram   DIAGNOSTIC MAMMOGRAM AND US IMPRESSION: Small area of architectural distortion in the posterior upper left breast at approximately 11 o'clock. Tissue sampling is recommended. [place] of left breast and axilla was negative    02/10/2017 Receptors her2   Estrogen Receptor: 90%, POSITIVE, STRONG STAINING INTENSITY Progesterone Receptor: 0%, NEGATIVE HER2 NOT AMPLIFIED   Proliferation Marker Ki67: 2%    02/10/2017 Initial Biopsy   Breast, left, needle core biopsy, upper inner - INVASIVE DUCTAL CARCINOMA. GRADE 1   02/10/2017 Initial Diagnosis   Malignant neoplasm of upper-inner quadrant of left breast in female, estrogen receptor positive ([place])   03/24/2017 Surgery   Left lumpectomy: IDC, g1, 0.5cm, margins negative, 5 SLN negative, ER positive, PR/HER-2 negative, Ki-67 2%.     05/06/2017 - 06/04/2017 Radiation Therapy   (Dr. Sondra Come): 1. Left breast// 40.05 Gy in 15 fractions.  2. Boost // 10 Gy in 5 fractions.     05/2017 -  Anti-estrogen oral therapy   Anastrozole daily   01/06/2018 Mammogram   01/06/2018 Mammogram IMPRESSION: No evidence of malignancy.      CURRENT THERAPY:  Anastrozole started on 06/18/17, willtemporarily hold it for3-4weeks, starting March 01, 2018, due to her fatigue, then restarted.  INTERVAL HISTORY:  [name] is here for a follow up of left breast cancer. She was last seen by me 6 months ago.  She notes she is doing well. She notes getting tired easily lately. She notes she can still do her routine activities. She feels she is getting enough sleep, but has been working more hours. She is not as active as before. She attributes her past abdominal pain to her then bladder infection. This resolved with antibiotics.    REVIEW OF SYSTEMS:   Constitutional: Denies fevers, chills or abnormal weight loss (+) fatigue  Eyes: Denies blurriness of vision Ears, nose, mouth, throat, and face: Denies mucositis or sore throat Respiratory: Denies cough, dyspnea or wheezes Cardiovascular: Denies palpitation, chest discomfort or lower extremity swelling Gastrointestinal:  Denies nausea, heartburn or change in bowel habits Skin: Denies  abnormal skin rashes Lymphatics: Denies new lymphadenopathy or easy bruising Neurological:Denies numbness, tingling or new weaknesses Behavioral/Psych: Mood is stable, no new changes    All other systems were reviewed with the patient and are negative.  MEDICAL HISTORY:  Past Medical History:  Diagnosis Date  . Breast cancer, left ([place]) 01/2017  . Glaucoma    no current med.  Marland Kitchen History of radiation therapy 05/06/17-06/04/17   left breast 40.05 Gy in 15 fractions, left breast boost 10 Gy in 5 fractions  . PONV (postoperative nausea and vomiting)     SURGICAL HISTORY: Past Surgical History:  Procedure Laterality Date  . BREAST BIOPSY    . BREAST LUMPECTOMY Left 02/2017   invasive ductal ca  . BREAST LUMPECTOMY WITH RADIOACTIVE SEED AND SENTINEL LYMPH NODE BIOPSY Left 03/24/2017   Procedure: BREAST PARTIAL MASTECTOMY WITH RADIOACTIVE SEED AND SENTINEL LYMPH NODE BIOPSY;  Surgeon: Erroll Luna, MD;  Location: [facility];  Service: General;  Laterality: Left;  . CATARACT EXTRACTION W/ INTRAOCULAR LENS IMPLANT      I have reviewed the social history and family history with the patient and they are unchanged from previous note.  ALLERGIES:  is allergic to latex.  MEDICATIONS:  Current Outpatient Medications  Medication Sig Dispense Refill  . anastrozole (ARIMIDEX) 1 MG tablet Take 1 tablet (1 mg total) by mouth daily. 90 tablet 3  . calcium carbonate (CALCIUM 600) 600 MG TABS tablet Take 1 tablet (600 mg total) by mouth 2 (two) times daily with a meal. 60 tablet   . cholecalciferol (VITAMIN D) 1000 units tablet Take 1 tablet (1,000 Units total) by mouth daily.     No current facility-administered medications for this visit.    PHYSICAL EXAMINATION: ECOG PERFORMANCE STATUS: 0 - Asymptomatic  No vitals taken today, Exam not performed today   LABORATORY DATA:  I have reviewed the data as listed CBC Latest Ref Rng & Units 12/20/2018 03/01/2018 09/14/2017  WBC 4.0 - 10.5 K/uL 5.4 4.7 6.0  Hemoglobin 12.0 - 15.0 g/dL 13.4 12.9 13.4  Hematocrit 36.0 - 46.0 % 41.0 38.0 41.1  Platelets 150 - 400 K/uL 153 141(L) 139(L)     CMP Latest Ref Rng &  Units 12/20/2018 03/01/2018 09/14/2017  Glucose 70 - 99 mg/dL 98 94 99  BUN 6 - 20 mg/dL '17 11 16  ' Creatinine 0.44 - 1.00 mg/dL 0.79 0.81 0.80  Sodium 135 - 145 mmol/L 137 140 138  Potassium 3.5 - 5.1 mmol/L 4.2 4.3 4.2  Chloride 98 - 111 mmol/L 103 103 104  CO2 22 - 32 mmol/L '24 28 26  ' Calcium 8.9 - 10.3 mg/dL 9.7 9.7 10.1  Total Protein 6.5 - 8.1 g/dL 7.5 7.5 7.5  Total Bilirubin 0.3 - 1.2 mg/dL 0.5 0.4 0.6  Alkaline Phos 38 - 126 U/L 61 68 64  AST 15 - 41 U/L '25 20 19  ' ALT 0 - 44 U/L 41 19 21      RADIOGRAPHIC STUDIES: I have personally reviewed the radiological images as listed and agreed with the findings in the report. No results found.   ASSESSMENT & PLAN:  [name] is a 59 y.o. female with    1. 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 She is s/p left lumpectomy and adjuvant radiation. -Given the small size of tumor, low-grade, she does not need  adjuvant chemotherapy. -She started antiestrogen therapy with Anastrozole in 05/2017.She held for 3-4 weeks in October 2019due to fatigue and restarted after.She has been tolerating well. -She is clinically doing well. Her fatigue is moderate but still manageable. Lab reviewed from her PCP visit in 03/2019. Her 12/2018 mammogram was normal. She has no breast concerns There is no clinical concern for recurrence. -Continue surveillance. Next mammogram in 12/2019.  -Continue Anastrozole.  -F/u in 6 months   2. Osteopenia -DEXA from 07/08/17 revealed a T score of -1.8 at the left femur neck  -I discussed her optionsofbisphosphonate or Prolia injection improve her bone density.She does not have very high risk for fracture, okay to hold it for now. -Continue taking oralCa and Vit D as well as being active and adding some weight bearing exercise for now -Next DEXA in  with Mammogram in 12/2019  3. Mid to right upper abdominal pain -occurred twice postprandial and only lasted 2 hours in early 2020.  -Based on her symptoms this is possibly related to her gallbladder. I recommend she have US abdomen with her PCP, she plans to f/u with them soon.   4. BP elevated   -Was mildly elevated 2020  -Has improved with PCP office visit per patient in 03/2019   PLAN: -Continue anastrozole  -Mammogram and DEXA in 12/2019  -Lab and F/u in 6 months    No problem-specific Assessment & Plan notes found for this encounter.   Orders Placed This Encounter  Procedures  . MM DIAG BREAST TOMO BILATERAL    Standing Status:   Future    Standing Expiration Date:   06/21/2020    Order Specific Question:   Reason for Exam (SYMPTOM  OR DIAGNOSIS REQUIRED)    Answer:   screening    Order Specific Question:   Is the patient pregnant?    Answer:   No    Order Specific Question:   Preferred imaging location?    Answer:   [facility]  . DG Bone Density    Standing Status:   Future    Standing Expiration Date:   06/21/2020    Order Specific Question:   Reason for Exam (SYMPTOM  OR DIAGNOSIS REQUIRED)    Answer:   screening    Order Specific Question:   Is the patient pregnant?    Answer:   No    Order Specific Question:   Preferred imaging location?    Answer:   [facility]   I discussed the assessment and treatment plan with the patient. The patient was provided an opportunity to ask questions and all were answered. The patient agreed with the plan and demonstrated an understanding of the instructions.  The patient was advised to call back or seek an in-person evaluation if the symptoms worsen or if the condition fails to improve as anticipated.  The total time spent in the appointment was 15 minutes.    Truitt Merle, MD 06/22/2019   I, Joslyn Devon, am acting as scribe for Truitt Merle, MD.   I have reviewed the above documentation for accuracy and  completeness, and I agree with the above.

## 2019-06-22 ENCOUNTER — Encounter: Payer: Self-pay | Admitting: Hematology

## 2019-06-22 ENCOUNTER — Inpatient Hospital Stay: Payer: BC Managed Care – PPO | Attending: Hematology | Admitting: Hematology

## 2019-06-22 ENCOUNTER — Ambulatory Visit: Payer: BC Managed Care – PPO | Admitting: Hematology

## 2019-06-22 DIAGNOSIS — R109 Unspecified abdominal pain: Secondary | ICD-10-CM

## 2019-06-22 DIAGNOSIS — M8580 Other specified disorders of bone density and structure, unspecified site: Secondary | ICD-10-CM

## 2019-06-22 DIAGNOSIS — Z79811 Long term (current) use of aromatase inhibitors: Secondary | ICD-10-CM | POA: Diagnosis not present

## 2019-06-22 DIAGNOSIS — E2839 Other primary ovarian failure: Secondary | ICD-10-CM

## 2019-06-22 DIAGNOSIS — I10 Essential (primary) hypertension: Secondary | ICD-10-CM

## 2019-06-22 DIAGNOSIS — Z170 Estrogen receptor positive status [ER+]: Secondary | ICD-10-CM | POA: Diagnosis not present

## 2019-06-22 DIAGNOSIS — C50212 Malignant neoplasm of upper-inner quadrant of left female breast: Secondary | ICD-10-CM

## 2019-06-22 DIAGNOSIS — Z923 Personal history of irradiation: Secondary | ICD-10-CM

## 2019-06-22 DIAGNOSIS — Z79899 Other long term (current) drug therapy: Secondary | ICD-10-CM

## 2019-06-22 DIAGNOSIS — R5383 Other fatigue: Secondary | ICD-10-CM

## 2019-06-23 ENCOUNTER — Telehealth: Payer: Self-pay | Admitting: Hematology

## 2019-06-23 NOTE — Telephone Encounter (Signed)
Scheduled appt per 1/27 los.  Sent a message to HIM pool to have a calendar mailed out. 

## 2019-10-19 ENCOUNTER — Other Ambulatory Visit: Payer: Self-pay | Admitting: Hematology

## 2019-10-27 DIAGNOSIS — H40003 Preglaucoma, unspecified, bilateral: Secondary | ICD-10-CM | POA: Diagnosis not present

## 2019-10-27 DIAGNOSIS — Z961 Presence of intraocular lens: Secondary | ICD-10-CM | POA: Diagnosis not present

## 2019-10-27 DIAGNOSIS — H26491 Other secondary cataract, right eye: Secondary | ICD-10-CM | POA: Diagnosis not present

## 2019-12-19 ENCOUNTER — Inpatient Hospital Stay: Payer: BLUE CROSS/BLUE SHIELD | Attending: Hematology | Admitting: Hematology

## 2019-12-19 ENCOUNTER — Inpatient Hospital Stay: Payer: BLUE CROSS/BLUE SHIELD

## 2019-12-19 ENCOUNTER — Other Ambulatory Visit: Payer: Self-pay

## 2019-12-19 ENCOUNTER — Encounter: Payer: Self-pay | Admitting: Hematology

## 2019-12-19 VITALS — BP 135/89 | HR 65 | Temp 98.1°F | Resp 16 | Ht 64.5 in | Wt 195.4 lb

## 2019-12-19 DIAGNOSIS — M8580 Other specified disorders of bone density and structure, unspecified site: Secondary | ICD-10-CM | POA: Insufficient documentation

## 2019-12-19 DIAGNOSIS — C50212 Malignant neoplasm of upper-inner quadrant of left female breast: Secondary | ICD-10-CM

## 2019-12-19 DIAGNOSIS — Z923 Personal history of irradiation: Secondary | ICD-10-CM | POA: Insufficient documentation

## 2019-12-19 DIAGNOSIS — R519 Headache, unspecified: Secondary | ICD-10-CM | POA: Diagnosis not present

## 2019-12-19 DIAGNOSIS — R030 Elevated blood-pressure reading, without diagnosis of hypertension: Secondary | ICD-10-CM | POA: Insufficient documentation

## 2019-12-19 DIAGNOSIS — Z79811 Long term (current) use of aromatase inhibitors: Secondary | ICD-10-CM | POA: Diagnosis not present

## 2019-12-19 DIAGNOSIS — R42 Dizziness and giddiness: Secondary | ICD-10-CM | POA: Diagnosis not present

## 2019-12-19 DIAGNOSIS — Z170 Estrogen receptor positive status [ER+]: Secondary | ICD-10-CM | POA: Diagnosis not present

## 2019-12-19 LAB — CBC WITH DIFFERENTIAL (CANCER CENTER ONLY)
Abs Immature Granulocytes: 0.01 10*3/uL (ref 0.00–0.07)
Basophils Absolute: 0 10*3/uL (ref 0.0–0.1)
Basophils Relative: 1 %
Eosinophils Absolute: 0.2 10*3/uL (ref 0.0–0.5)
Eosinophils Relative: 3 %
HCT: 39 % (ref 36.0–46.0)
Hemoglobin: 13 g/dL (ref 12.0–15.0)
Immature Granulocytes: 0 %
Lymphocytes Relative: 28 %
Lymphs Abs: 1.6 10*3/uL (ref 0.7–4.0)
MCH: 31 pg (ref 26.0–34.0)
MCHC: 33.3 g/dL (ref 30.0–36.0)
MCV: 93.1 fL (ref 80.0–100.0)
Monocytes Absolute: 0.5 10*3/uL (ref 0.1–1.0)
Monocytes Relative: 9 %
Neutro Abs: 3.4 10*3/uL (ref 1.7–7.7)
Neutrophils Relative %: 59 %
Platelet Count: 158 10*3/uL (ref 150–400)
RBC: 4.19 MIL/uL (ref 3.87–5.11)
RDW: 12.6 % (ref 11.5–15.5)
WBC Count: 5.8 10*3/uL (ref 4.0–10.5)
nRBC: 0 % (ref 0.0–0.2)

## 2019-12-19 LAB — CMP (CANCER CENTER ONLY)
ALT: 21 U/L (ref 0–44)
AST: 17 U/L (ref 15–41)
Albumin: 4.1 g/dL (ref 3.5–5.0)
Alkaline Phosphatase: 62 U/L (ref 38–126)
Anion gap: 9 (ref 5–15)
BUN: 18 mg/dL (ref 6–20)
CO2: 24 mmol/L (ref 22–32)
Calcium: 10.1 mg/dL (ref 8.9–10.3)
Chloride: 105 mmol/L (ref 98–111)
Creatinine: 0.78 mg/dL (ref 0.44–1.00)
GFR, Est AFR Am: >60 mL/min (ref >60)
GFR, Estimated: >60 mL/min (ref >60)
Glucose, Bld: 99 mg/dL (ref 70–99)
Potassium: 4.3 mmol/L (ref 3.5–5.1)
Sodium: 138 mmol/L (ref 135–145)
Total Bilirubin: 0.5 mg/dL (ref 0.3–1.2)
Total Protein: 7.5 g/dL (ref 6.5–8.1)

## 2019-12-19 NOTE — Progress Notes (Signed)
[name]   [phone] Fax:([phone]   Clinic Follow up Note   Patient Care Team: [name], MD as PCP - General (Family Medicine) Erroll Luna, MD as Consulting Physician (General Surgery) Truitt Merle, MD as Consulting Physician (Hematology) Gery Pray, MD as Consulting Physician (Radiation Oncology) Gardenia Phlegm, NP as Nurse Practitioner (Hematology and Oncology)  Date of Service:  12/19/2019  CHIEF COMPLAINT: F/u of Left breast cancer   SUMMARY OF ONCOLOGIC HISTORY: Oncology History Overview Note  Cancer Staging Malignant neoplasm of upper-inner quadrant of left breast in female, estrogen receptor positive ([place]) Staging form: Breast, AJCC 8th Edition - Clinical stage from 02/10/2017: Stage Unknown (cTX, cN0, cM0, G1, ER: Positive, PR: Negative, HER2: Negative) - Signed by Truitt Merle, MD on 02/17/2017     Malignant neoplasm of upper-inner quadrant of left breast in female, estrogen receptor positive ([place])  01/23/2017 Mammogram   DIAGNOSTIC MAMMOGRAM AND US IMPRESSION: Small area of architectural distortion in the posterior upper left breast at approximately 11 o'clock. Tissue sampling is recommended. [place] of left breast and axilla was negative    02/10/2017 Receptors her2   Estrogen Receptor: 90%, POSITIVE, STRONG STAINING INTENSITY Progesterone Receptor: 0%, NEGATIVE HER2 NOT AMPLIFIED  Proliferation Marker Ki67: 2%    02/10/2017 Initial Biopsy   Breast, left, needle core biopsy, upper inner - INVASIVE DUCTAL CARCINOMA. GRADE 1   02/10/2017 Initial Diagnosis   Malignant neoplasm of upper-inner quadrant of left breast in female, estrogen receptor positive ([place])   03/24/2017 Surgery   Left lumpectomy: IDC, g1, 0.5cm, margins negative, 5 SLN negative, ER positive, PR/HER-2 negative, Ki-67 2%.     05/06/2017 - 06/04/2017 Radiation Therapy   (Dr. Sondra Come): 1. Left breast// 40.05 Gy in 15 fractions.  2. Boost // 10 Gy in 5  fractions.     05/2017 -  Anti-estrogen oral therapy   Anastrozole 23m daily started in 05/2017. She held for 3-4 weeks in October 2019 due to fatigue and restarted after.    01/06/2018 Mammogram   01/06/2018 Mammogram IMPRESSION: No evidence of malignancy.      CURRENT THERAPY:  Anastrozole 166mdaily started in 05/2017.She held for 3-4 weeks in October 2019due to fatigue and restarted after.   INTERVAL HISTORY:  [name] is here for a follow up of left breast cancer. She was last seen by me 6 months ago. She presents to the clinic alone. She notes recently having vertigo intermittently. She has been taking Sudafed OTC. She notes she is tolerating Anastrozole well. She notes occasional headaches. She notes her hearing is normal. She notes her energy is mildly decreased but manageable. She is on Calcium and Vit D for her bones. She notes walking as indicated for activity.  She notes she still has left breast tenderness, but tolerable.     REVIEW OF SYSTEMS:   Constitutional: Denies fevers, chills or abnormal weight loss (+) Occasional HA (+)Vertigo symptoms  Eyes: Denies blurriness of vision Ears, nose, mouth, throat, and face: Denies mucositis or sore throat Respiratory: Denies cough, dyspnea or wheezes Cardiovascular: Denies palpitation, chest discomfort or lower extremity swelling Gastrointestinal:  Denies nausea, heartburn or change in bowel habits Skin: Denies abnormal skin rashes Lymphatics: Denies new lymphadenopathy or easy bruising Neurological:Denies numbness, tingling or new weaknesses Behavioral/Psych: Mood is stable, no new changes  Breast: (+) Left breast tenderness s/p surgery  All other systems were reviewed with the patient and are negative.  MEDICAL HISTORY:  Past Medical History:  Diagnosis Date  . Breast cancer, left ([place]) 01/2017  . Glaucoma    no current med.  Marland Kitchen History of radiation therapy 05/06/17-06/04/17   left breast 40.05 Gy in 15  fractions, left breast boost 10 Gy in 5 fractions  . PONV (postoperative nausea and vomiting)     SURGICAL HISTORY: Past Surgical History:  Procedure Laterality Date  . BREAST BIOPSY    . BREAST LUMPECTOMY Left 02/2017   invasive ductal ca  . BREAST LUMPECTOMY WITH RADIOACTIVE SEED AND SENTINEL LYMPH NODE BIOPSY Left 03/24/2017   Procedure: BREAST PARTIAL MASTECTOMY WITH RADIOACTIVE SEED AND SENTINEL LYMPH NODE BIOPSY;  Surgeon: Erroll Luna, MD;  Location: [facility];  Service: General;  Laterality: Left;  . CATARACT EXTRACTION W/ INTRAOCULAR LENS IMPLANT      I have reviewed the social history and family history with the patient and they are unchanged from previous note.  ALLERGIES:  is allergic to latex.  MEDICATIONS:  Current Outpatient Medications  Medication Sig Dispense Refill  . anastrozole (ARIMIDEX) 1 MG tablet TAKE 1 TABLET BY MOUTH EVERY DAY 90 tablet 3  . calcium carbonate (CALCIUM 600) 600 MG TABS tablet Take 1 tablet (600 mg total) by mouth 2 (two) times daily with a meal. 60 tablet   . cholecalciferol (VITAMIN D) 1000 units tablet Take 1 tablet (1,000 Units total) by mouth daily.    . Multiple Vitamin (MULTI-VITAMIN) tablet Take 1 tablet by mouth daily.     No current facility-administered medications for this visit.    PHYSICAL EXAMINATION: ECOG PERFORMANCE STATUS: 1 - Symptomatic but completely ambulatory  Vitals:   12/19/19 1047  BP: (!) 135/89  Pulse: 65  Resp: 16  Temp: 98.1 F (36.7 C)  SpO2: 100%   Filed Weights   12/19/19 1047  Weight: 195 lb 6.4 oz (88.6 kg)    GENERAL:alert, no distress and comfortable SKIN: skin color, texture, turgor are normal, no rashes or significant lesions EYES: normal, Conjunctiva are pink and non-injected, sclera clear  NECK: supple, thyroid normal size, non-tender, without nodularity LYMPH:  no palpable lymphadenopathy in the cervical, axillary  LUNGS: clear to auscultation and percussion with  normal breathing effort HEART: regular rate & rhythm and no murmurs and no lower extremity edema ABDOMEN:abdomen soft, non-tender and normal bowel sounds Musculoskeletal:no cyanosis of digits and no clubbing  NEURO: alert & oriented x 3 with fluent speech, no focal motor/sensory deficits BREAST: S/p left lumpectomy: Surgical incision healed well. No palpable mass, nodules or adenopathy bilaterally. Breast exam benign.   LABORATORY DATA:  I have reviewed the data as listed CBC Latest Ref Rng & Units 12/19/2019 12/20/2018 03/01/2018  WBC 4.0 - 10.5 K/uL 5.8 5.4 4.7  Hemoglobin 12.0 - 15.0 g/dL 13.0 13.4 12.9  Hematocrit 36 - 46 % 39.0 41.0 38.0  Platelets 150 - 400 K/uL 158 153 141(L)     CMP Latest Ref Rng & Units 12/19/2019 12/20/2018 03/01/2018  Glucose 70 - 99 mg/dL 99 98 94  BUN 6 - 20 mg/dL _0 Creatinine 0.44 - 1.00 mg/dL 0.78 0.79 0.81  Sodium 135 - 145 mmol/L 138 137 140  Potassium 3.5 - 5.1 mmol/L 4.3 4.2 4.3  Chloride 98 - 111 mmol/L 105 103 103  CO2 22 - 32 mmol/L _1 Calcium 8.9 - 10.3 mg/dL 10.1 9.7 9.7  Total Protein 6.5 - 8.1 g/dL 7.5 7.5 7.5  Total Bilirubin 0.3 - 1.2 mg/dL 0.5 0.5 0.4  Alkaline Phos  38 - 126 U/L 62 61 68  AST 15 - 41 U/L _0 ALT 0 - 44 U/L 21 41 19      RADIOGRAPHIC STUDIES: I have personally reviewed the radiological images as listed and agreed with the findings in the report. No results found.   ASSESSMENT & PLAN:  [name] is a 59 y.o. female with    1. 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 She is s/p left lumpectomy and adjuvant radiation. -Given the small size of tumor, low-grade, she does not need adjuvant chemotherapy. -She started antiestrogen therapy with Anastrozole in 05/2017.She held for 3-4 weeks in October 2019due to fatigue and restarted  after.She has been tolerating well. -Her prior abdominal pain has resolved.  -She is clinically doing well. Lab reviewed, her CBC and CMP are within normal limits. Her physical exam and her 12/2018 mammogram were unremarkable. There is no clinical concern for recurrence. -she noticed intermittent vertigo lately, neuro exam was unremarkable, I encourage her to f/u with PCP, or see ENT if it gets worse. I have low suspicion for brain metastasis.  -Continue surveillance. Next mammogram in 02/2020.  -Continue Anastrozole.  -pt prefers to be seen less frequently. F/u in 9 months. I encouraged her to watch for worsening vertigo, headaches and concerning symptoms of breast cancer recurrence.   2. Osteopenia -DEXA from 07/08/17 revealed a T score of -1.8 at the left femur neck  -I discussed her optionsofbisphosphonateor Prolia injection improve her bone density.She does not have very high risk for fracture, okay to hold it for now. -Continue taking oralCa and Vit D. I encouraged her to be active with walking.  -Next DEXA inwith Mammogram in 02/2020  4. Mild BP elevated  -Onset in 2020 -BP at 135/89 today  -I encouraged her to continue to monitor with her PCP   5. Vertigo Symptoms  -She has been having intermittent vertigo symptoms since 06/2019. She notes her sisters have meniere's disease but she denies hearing changes.  -She has been taking Sudafed. I suggest she use Meclizine OTC.  -She does note occasional headaches. Neuro exam normal today (12/19/19). I discussed is this was related to her cancer her symptoms would be more consistent.  -If this worsens I recommend she see an ENT.    PLAN: -Continue anastrozole  -Mammogram and DEXA in 02/2020 scheduled  -Lab and F/u with NP Lacie in 9 months    No problem-specific Assessment & Plan notes found for this encounter.   No orders of the defined types were placed in this encounter.  All questions were answered. The patient knows  to call the clinic with any problems, questions or concerns. No barriers to learning was detected. The total time spent in the appointment was 30 minutes.     Truitt Merle, MD 12/19/2019   I, Joslyn Devon, am acting as scribe for Truitt Merle, MD.   I have reviewed the above documentation for accuracy and completeness, and I agree with the above.

## 2019-12-20 ENCOUNTER — Telehealth: Payer: Self-pay | Admitting: Hematology

## 2019-12-20 NOTE — Telephone Encounter (Signed)
Scheduled per 7/26 los. Pt is aware of appt times and date.

## 2020-03-02 ENCOUNTER — Other Ambulatory Visit: Payer: Self-pay

## 2020-04-30 ENCOUNTER — Ambulatory Visit: Payer: Self-pay | Admitting: *Deleted

## 2020-04-30 NOTE — Telephone Encounter (Signed)
C/o upper abdominal pain since Saturday. Burning feeling and now pain with after swallowing water, or food. Patient reports she has tried antacids with no relief. Diarrhea on Saturday. Denies chest pain , difficulty  Breathing, nausea, vomiting. Patient reports her PCP if not available until after January. Patient is requesting appt at [facility]. Instructed patient to call if symptoms worsen tonight or go to UC or ED. Care advise given. Patient verbalized understanding of care advise and will go to [place].  Reason for Disposition . [1] MILD-MODERATE pain AND [2] not relieved by antacids  Answer Assessment - Initial Assessment Questions 1. LOCATION: "Where does it hurt?"      Upper middle part of abdomen 2. RADIATION: "Does the pain shoot anywhere else?" (e.g., chest, back)     No  3. ONSET: "When did the pain begin?" (e.g., minutes, hours or days ago)      Saturday  4. SUDDEN: "Gradual or sudden onset?"     Gradual  5. PATTERN "Does the pain come and go, or is it constant?"    - If constant: "Is it getting better, staying the same, or worsening?"      (Note: Constant means the pain never goes away completely; most serious pain is constant and it progresses)     - If intermittent: "How long does it last?" "Do you have pain now?"     (Note: Intermittent means the pain goes away completely between bouts)     Comes and goes only with swallowing and eating  6. SEVERITY: "How bad is the pain?"  (e.g., Scale 1-10; mild, moderate, or severe)    - MILD (1-3): doesn't interfere with normal activities, abdomen soft and not tender to touch     - MODERATE (4-7): interferes with normal activities or awakens from sleep, tender to touch     - SEVERE (8-10): excruciating pain, doubled over, unable to do any normal activities       Moderate  7. RECURRENT SYMPTOM: "Have you ever had this type of stomach pain before?" If Yes, ask: "When was the last time?" and "What happened that  time?"      No  8. AGGRAVATING FACTORS: "Does anything seem to cause this pain?" (e.g., foods, stress, alcohol)     Any foods or liquids  9. CARDIAC SYMPTOMS: "Do you have any of the following symptoms: chest pain, difficulty breathing, sweating, nausea?"     No  10. OTHER SYMPTOMS: "Do you have any other symptoms?" (e.g., fever, vomiting, diarrhea)       No diarrhea Saturday  11. PREGNANCY: "Is there any chance you are pregnant?" "When was your last menstrual period?"       na  Protocols used: ABDOMINAL PAIN - UPPER-A-AH

## 2020-05-22 DIAGNOSIS — Z20822 Contact with and (suspected) exposure to covid-19: Secondary | ICD-10-CM | POA: Diagnosis not present

## 2020-06-18 ENCOUNTER — Ambulatory Visit
Admission: RE | Admit: 2020-06-18 | Discharge: 2020-06-18 | Disposition: A | Discharge status: Home / Self Care | Payer: BLUE CROSS/BLUE SHIELD | Source: Ambulatory Visit | Attending: Hematology | Admitting: Hematology

## 2020-06-18 ENCOUNTER — Other Ambulatory Visit: Payer: Self-pay

## 2020-06-18 DIAGNOSIS — Z853 Personal history of malignant neoplasm of breast: Secondary | ICD-10-CM | POA: Diagnosis not present

## 2020-06-18 DIAGNOSIS — Z170 Estrogen receptor positive status [ER+]: Secondary | ICD-10-CM

## 2020-06-18 DIAGNOSIS — C50212 Malignant neoplasm of upper-inner quadrant of left female breast: Secondary | ICD-10-CM

## 2020-06-18 DIAGNOSIS — Z780 Asymptomatic menopausal state: Secondary | ICD-10-CM | POA: Diagnosis not present

## 2020-06-18 DIAGNOSIS — E2839 Other primary ovarian failure: Secondary | ICD-10-CM

## 2020-06-18 DIAGNOSIS — M8589 Other specified disorders of bone density and structure, multiple sites: Secondary | ICD-10-CM | POA: Diagnosis not present

## 2020-06-19 ENCOUNTER — Telehealth: Payer: Self-pay

## 2020-06-19 NOTE — Telephone Encounter (Signed)
Spoke with patient explained the difference between osteopenia and osteoporosis  Also explained how Zometa may be beneficial in this case further explained up coming appointments pt appreciative of call will follow up in April nothing further call ended

## 2020-06-19 NOTE — Telephone Encounter (Signed)
-----   Message from Truitt Merle, MD sent at 06/19/2020 11:02 AM EST ----- Please let pt know her DEXA result, she has osteopenia, worse than 2019. I recommend zometa infusion every 6 months for 2 years, will discuss on her next appointment with Lacie in April, [place] to move her appointment up if she wants, thanks   Truitt Merle  06/19/2020

## 2020-07-03 DIAGNOSIS — Z961 Presence of intraocular lens: Secondary | ICD-10-CM | POA: Diagnosis not present

## 2020-07-03 DIAGNOSIS — H40003 Preglaucoma, unspecified, bilateral: Secondary | ICD-10-CM | POA: Diagnosis not present

## 2020-07-18 ENCOUNTER — Telehealth: Payer: Self-pay | Admitting: Nurse Practitioner

## 2020-07-18 NOTE — Telephone Encounter (Signed)
Contacted patient about moved appointment time per providers template. Patient is aware.

## 2020-09-18 NOTE — Progress Notes (Signed)
[facility]   Telephone:(336) [phone] Fax:(336) [phone]   Clinic Follow up Note   Patient Care Team: [name], MD as PCP - General (Family Medicine) Erroll Luna, MD as Consulting Physician (General Surgery) Truitt Merle, MD as Consulting Physician (Hematology) Gery Pray, MD as Consulting Physician (Radiation Oncology) Gardenia Phlegm, NP as Nurse Practitioner (Hematology and Oncology) 09/19/2020  CHIEF COMPLAINT: Follow-up left breast cancer  SUMMARY OF ONCOLOGIC HISTORY: Oncology History Overview Note  Cancer Staging Malignant neoplasm of upper-inner quadrant of left breast in female, estrogen receptor positive ([place]) Staging form: Breast, AJCC 8th Edition - Clinical stage from 02/10/2017: Stage Unknown (cTX, cN0, cM0, G1, ER: Positive, PR: Negative, HER2: Negative) - Signed by Truitt Merle, MD on 02/17/2017     Malignant neoplasm of upper-inner quadrant of left breast in female, estrogen receptor positive ([place])  01/23/2017 Mammogram   DIAGNOSTIC MAMMOGRAM AND US IMPRESSION: Small area of architectural distortion in the posterior upper left breast at approximately 11 o'clock. Tissue sampling is recommended. [place] of left breast and axilla was negative    02/10/2017 Receptors her2   Estrogen Receptor: 90%, POSITIVE, STRONG STAINING INTENSITY Progesterone Receptor: 0%, NEGATIVE HER2 NOT AMPLIFIED  Proliferation Marker Ki67: 2%    02/10/2017 Initial Biopsy   Breast, left, needle core biopsy, upper inner - INVASIVE DUCTAL CARCINOMA. GRADE 1   02/10/2017 Initial Diagnosis   Malignant neoplasm of upper-inner quadrant of left breast in female, estrogen receptor positive ([place])   03/24/2017 Surgery   Left lumpectomy: IDC, g1, 0.5cm, margins negative, 5 SLN negative, ER positive, PR/HER-2 negative, Ki-67 2%.     05/06/2017 - 06/04/2017 Radiation Therapy   (Dr. Sondra Come): 1. Left breast// 40.05 Gy in 15 fractions.  2. Boost // 10 Gy in 5 fractions.      05/2017 -  Anti-estrogen oral therapy   Anastrozole 52m daily started in 05/2017. She held for 3-4 weeks in October 2019 due to fatigue and restarted after.    01/06/2018 Mammogram   01/06/2018 Mammogram IMPRESSION: No evidence of malignancy.     CURRENT THERAPY: Anastrozole 121mdaily started in 05/2017.She held for 3-4 weeks in October 2019due to fatigue and restarted after.  INTERVAL HISTORY: [name] for follow-up as scheduled.  She was last seen by Dr. FeBurr Medicon 12/19/2019.  DEXA on 06/18/2020 showed worsening osteopenia.  She is on calcium and vitamin D.  Mammogram same day showed breast density category C, negative.  She denies concerns in her breast such as new lump/mass, nipple discharge or inversion, or skin change.  She is tired, working long hours in tax season.  She takes 2 ibuprofen twice daily for the past 2-3 months for "achiness."  Couple months ago she developed an episode of epigastric pain with difficulty swallowing water, this has occurred once in the last month or so.  She thinks this is related to her gallbladder.  Does not feel like typical GERD.  She is not taking any medication or has been evaluated for this.  Denies unintentional weight loss, recent infection, change in bowel habits, GI bleeding, cough, chest pain, dyspnea, or other concerns.  She is not up-to-date on colonoscopy/EGD except stool test in the past. All other systems were reviewed with the patient and are negative.   MEDICAL HISTORY:  Past Medical History:  Diagnosis Date  . [facility]   L BREAST IDC  . Breast cancer, [facility]  . Glaucoma    no current med.  .Marland Kitchen  History of radiation therapy 05/06/17-06/04/17   left breast 40.05 Gy in 15 fractions, left breast boost 10 Gy in 5 fractions  . PONV (postoperative nausea and vomiting)     SURGICAL HISTORY: Past Surgical History:  Procedure Laterality Date  . BREAST BIOPSY Left 02/10/2017   IDC  . BREAST LUMPECTOMY  Left 03/23/2017   invasive ductal ca  . BREAST LUMPECTOMY WITH RADIOACTIVE SEED AND SENTINEL LYMPH NODE BIOPSY Left 03/24/2017   Procedure: BREAST PARTIAL MASTECTOMY WITH RADIOACTIVE SEED AND SENTINEL LYMPH NODE BIOPSY;  Surgeon: Erroll Luna, MD;  Location: [facility];  Service: General;  Laterality: Left;  . CATARACT EXTRACTION W/ INTRAOCULAR LENS IMPLANT      I have reviewed the social history and family history with the patient and they are unchanged from previous note.  ALLERGIES:  is allergic to latex.  MEDICATIONS:  Current Outpatient Medications  Medication Sig Dispense Refill  . anastrozole (ARIMIDEX) 1 MG tablet TAKE 1 TABLET BY MOUTH EVERY DAY 90 tablet 3  . calcium carbonate (CALCIUM 600) 600 MG TABS tablet Take 1 tablet (600 mg total) by mouth 2 (two) times daily with a meal. 60 tablet   . cholecalciferol (VITAMIN D) 1000 units tablet Take 1 tablet (1,000 Units total) by mouth daily.    . Multiple Vitamin (MULTI-VITAMIN) tablet Take 1 tablet by mouth daily.     No current facility-administered medications for this visit.    PHYSICAL EXAMINATION: ECOG PERFORMANCE STATUS: 1 - Symptomatic but completely ambulatory  Vitals:   09/19/20 1034  BP: 134/72  Pulse: 72  Resp: 18  Temp: 97.8 F (36.6 C)  SpO2: 100%   Filed Weights   09/19/20 1034  Weight: 205 lb 6.4 oz (93.2 kg)    GENERAL:alert, no distress and comfortable SKIN: No rash EYES:  sclera clear ORAL: no thrush, ulcers, or obvious periodontal disease LYMPH:  no palpable cervical or supraclavicular lymphadenopathy  LUNGS: clear with normal breathing effort HEART: regular rate & rhythm, no lower extremity edema ABDOMEN:abdomen soft with normal bowel sounds.  Mild epigastric TTP  NEURO: alert & oriented x 3 with fluent speech, no focal motor/sensory deficits Breast exam: Breasts are symmetric without nipple discharge or inversion.  S/p left lumpectomy, incisions completely healed.  No  palpable mass in either breast or axilla that I could appreciate.  LABORATORY DATA:  I have reviewed the data as listed CBC Latest Ref Rng & Units 09/19/2020 12/19/2019 12/20/2018  WBC 4.0 - 10.5 K/uL 6.2 5.8 5.4  Hemoglobin 12.0 - 15.0 g/dL 13.6 13.0 13.4  Hematocrit 36.0 - 46.0 % 41.1 39.0 41.0  Platelets 150 - 400 K/uL 171 158 153     CMP Latest Ref Rng & Units 09/19/2020 12/19/2019 12/20/2018  Glucose 70 - 99 mg/dL 97 99 98  BUN 8 - 23 mg/dL '18 18 17  ' Creatinine 0.44 - 1.00 mg/dL 0.78 0.78 0.79  Sodium 135 - 145 mmol/L 140 138 137  Potassium 3.5 - 5.1 mmol/L 4.7 4.3 4.2  Chloride 98 - 111 mmol/L 104 105 103  CO2 22 - 32 mmol/L '24 24 24  ' Calcium 8.9 - 10.3 mg/dL 9.4 10.1 9.7  Total Protein 6.5 - 8.1 g/dL 7.9 7.5 7.5  Total Bilirubin 0.3 - 1.2 mg/dL 0.5 0.5 0.5  Alkaline Phos 38 - 126 U/L 63 62 61  AST 15 - 41 U/L '26 17 25  ' ALT 0 - 44 U/L 37 21 41      RADIOGRAPHIC STUDIES: I have personally  reviewed the radiological images as listed and agreed with the findings in the report. No results found.   ASSESSMENT & PLAN: [name] is a 60 y.o. female with    1. 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 S/p left lumpectomy and adjuvant radiation. Given the small, low-grade tumor she did not need adjuvant chemotherapy -She started antiestrogen therapy with Anastrozole in 05/2017.She held for 3-4 weeks in October 2019due to fatigue and restarted after.She has been tolerating well. -05/2020 mammogram is negative -Continue surveillance and anastrozole.   2. Osteopenia -DEXA from 07/08/17 revealed a T score of -1.8 at the left femur neck, worsened to -2.0 on 06/18/2020, BMD in all areas worsened slightly from 2019 to 2022 -FRAX shows 9% risk of major osteoporotic fracture, and 1% risk of hip fracture -Continue taking oralCa and  Vit D. I encouraged her to be active with walking.  -Given worsening osteopenia, we discussed starting medication with bisphosphonate, she is agreeable  3.  Epigastric pain and liquid dysphagia -She has had a double rare episodes in the past few months, no weight loss -Exam shows mild epigastric TTP -LFTs normal -She has a high stress job, long hours, and heavy NSAID use for general achiness.  She is at risk for GI ulcer.  She is not anemic, no concern for acute bleed -I discussed GI referral for endoscopy evaluation, she will follow-up with PCP to discuss -Cautioned her to avoid NSAID use, use Tylenol if needed  Disposition: Ms. Segers is clinically doing well from a breast cancer standpoint.  She continues anastrozole, tolerating well.  Breast exam is benign, CBC and CMP are normal, mammogram 05/2020 is negative.  Overall there is no clinical concern for breast cancer recurrence.  We discussed she is nearing 4 years from initial diagnosis, the recurrence risk is decreased.  Continue surveillance, healthy lifestyle, and AI.   We reviewed her recent DEXA which shows worsening osteopenia, she is on calcium and vitamin D.  I reviewed the risk/benefit and potential side effects of Zometa infusion including but not limited to hypocalcemia, bone/joint pain, headache, renal dysfunction, osteonecrosis of the jaw.  I recommend every 6 month x2 years, she agrees to proceed.  She had a recent dental checkup, no ongoing oral issues.  We will get dental clearance from Dr. Judeth Horn.   She recently developed episodes epigastric pain and liquid dysphagia, slightly tender on exam.  Coupled with stressful job and heavy NSAID use she is at risk for GI ulcer.  I have a low suspicion this is related to her gallbladder given no RUQ tenderness and normal LFTs.  I discussed GI referral for endoscopy (she also has not had screening colonoscopy), she will start with her PCP.  She will return to start Zometa in the next few  weeks at her convenience, then for routine breast cancer surveillance visit and second Zometa 6 months after that.  All questions were answered. The patient knows to call the clinic with any problems, questions or concerns. No barriers to learning were detected.  Total encounter time was 30 minutes.     Shelby Feeling, NP 09/19/20

## 2020-09-19 ENCOUNTER — Inpatient Hospital Stay: Payer: BC Managed Care – PPO | Attending: Nurse Practitioner | Admitting: Nurse Practitioner

## 2020-09-19 ENCOUNTER — Inpatient Hospital Stay: Payer: BC Managed Care – PPO

## 2020-09-19 ENCOUNTER — Other Ambulatory Visit: Payer: Self-pay

## 2020-09-19 ENCOUNTER — Encounter: Payer: Self-pay | Admitting: Nurse Practitioner

## 2020-09-19 VITALS — BP 134/72 | HR 72 | Temp 97.8°F | Resp 18 | Ht 64.5 in | Wt 205.4 lb

## 2020-09-19 DIAGNOSIS — C50212 Malignant neoplasm of upper-inner quadrant of left female breast: Secondary | ICD-10-CM

## 2020-09-19 DIAGNOSIS — Z170 Estrogen receptor positive status [ER+]: Secondary | ICD-10-CM | POA: Diagnosis not present

## 2020-09-19 DIAGNOSIS — R1310 Dysphagia, unspecified: Secondary | ICD-10-CM | POA: Diagnosis not present

## 2020-09-19 DIAGNOSIS — Z923 Personal history of irradiation: Secondary | ICD-10-CM | POA: Insufficient documentation

## 2020-09-19 DIAGNOSIS — M8580 Other specified disorders of bone density and structure, unspecified site: Secondary | ICD-10-CM | POA: Insufficient documentation

## 2020-09-19 DIAGNOSIS — R1013 Epigastric pain: Secondary | ICD-10-CM | POA: Diagnosis not present

## 2020-09-19 DIAGNOSIS — M8589 Other specified disorders of bone density and structure, multiple sites: Secondary | ICD-10-CM

## 2020-09-19 DIAGNOSIS — Z79811 Long term (current) use of aromatase inhibitors: Secondary | ICD-10-CM | POA: Insufficient documentation

## 2020-09-19 LAB — CMP (CANCER CENTER ONLY)
ALT: 37 U/L (ref 0–44)
AST: 26 U/L (ref 15–41)
Albumin: 4.4 g/dL (ref 3.5–5.0)
Alkaline Phosphatase: 63 U/L (ref 38–126)
Anion gap: 12 (ref 5–15)
BUN: 18 mg/dL (ref 8–23)
CO2: 24 mmol/L (ref 22–32)
Calcium: 9.4 mg/dL (ref 8.9–10.3)
Chloride: 104 mmol/L (ref 98–111)
Creatinine: 0.78 mg/dL (ref 0.44–1.00)
GFR, Estimated: >60 mL/min (ref >60)
Glucose, Bld: 97 mg/dL (ref 70–99)
Potassium: 4.7 mmol/L (ref 3.5–5.1)
Sodium: 140 mmol/L (ref 135–145)
Total Bilirubin: 0.5 mg/dL (ref 0.3–1.2)
Total Protein: 7.9 g/dL (ref 6.5–8.1)

## 2020-09-19 LAB — CBC WITH DIFFERENTIAL (CANCER CENTER ONLY)
Abs Immature Granulocytes: 0.01 10*3/uL (ref 0.00–0.07)
Basophils Absolute: 0.1 10*3/uL (ref 0.0–0.1)
Basophils Relative: 1 %
Eosinophils Absolute: 0.2 10*3/uL (ref 0.0–0.5)
Eosinophils Relative: 2 %
HCT: 41.1 % (ref 36.0–46.0)
Hemoglobin: 13.6 g/dL (ref 12.0–15.0)
Immature Granulocytes: 0 %
Lymphocytes Relative: 35 %
Lymphs Abs: 2.2 10*3/uL (ref 0.7–4.0)
MCH: 30 pg (ref 26.0–34.0)
MCHC: 33.1 g/dL (ref 30.0–36.0)
MCV: 90.7 fL (ref 80.0–100.0)
Monocytes Absolute: 0.5 10*3/uL (ref 0.1–1.0)
Monocytes Relative: 8 %
Neutro Abs: 3.3 10*3/uL (ref 1.7–7.7)
Neutrophils Relative %: 54 %
Platelet Count: 171 10*3/uL (ref 150–400)
RBC: 4.53 MIL/uL (ref 3.87–5.11)
RDW: 12.6 % (ref 11.5–15.5)
WBC Count: 6.2 10*3/uL (ref 4.0–10.5)
nRBC: 0 % (ref 0.0–0.2)

## 2020-09-19 NOTE — Patient Instructions (Signed)
Zoledronic Acid Injection (Paget's Disease, Osteoporosis) What is this medicine? ZOLEDRONIC ACID (ZOE le dron ik AS id) slows calcium loss from bones. It treats Paget's disease and osteoporosis. It may be used in other people at risk for bone loss. This medicine may be used for other purposes; ask your health care provider or pharmacist if you have questions. COMMON BRAND NAME(S): Reclast, Zometa What should I tell my health care provider before I take this medicine? They need to know if you have any of these conditions:  bleeding disorder  cancer  dental disease  kidney disease  low levels of calcium in the blood  low red blood cell counts  lung or breathing disease (asthma)  receiving steroids like dexamethasone or prednisone  an unusual or allergic reaction to zoledronic acid, other medicines, foods, dyes, or preservatives  pregnant or trying to get pregnant  breast-feeding How should I use this medicine? This drug is injected into a vein. It is given by a health care provider in a hospital or clinic setting. A special MedGuide will be given to you before each treatment. Be sure to read this information carefully each time. Talk to your health care provider about the use of this drug in children. Special care may be needed. Overdosage: If you think you have taken too much of this medicine contact a poison control center or emergency room at once. NOTE: This medicine is only for you. Do not share this medicine with others. What if I miss a dose? Keep appointments for follow-up doses. It is important not to miss your dose. Call your health care provider if you are unable to keep an appointment. What may interact with this medicine?  certain antibiotics given by injection  NSAIDs, medicines for pain and inflammation, like ibuprofen or naproxen  some diuretics like bumetanide, furosemide  teriparatide This list may not describe all possible interactions. Give your health  care provider a list of all the medicines, herbs, non-prescription drugs, or dietary supplements you use. Also tell them if you smoke, drink alcohol, or use illegal drugs. Some items may interact with your medicine. What should I watch for while using this medicine? Visit your health care provider for regular checks on your progress. It may be some time before you see the benefit from this drug. Some people who take this drug have severe bone, joint, or muscle pain. This drug may also increase your risk for jaw problems or a broken thigh bone. Tell your health care provider right away if you have severe pain in your jaw, bones, joints, or muscles. Tell you health care provider if you have any pain that does not go away or that gets worse. You should make sure you get enough calcium and vitamin D while you are taking this drug. Discuss the foods you eat and the vitamins you take with your health care provider. You may need blood work done while you are taking this drug. Tell your dentist and dental surgeon that you are taking this drug. You should not have major dental surgery while on this drug. See your dentist to have a dental exam and fix any dental problems before starting this drug. Take good care of your teeth while on this drug. Make sure you see your dentist for regular follow-up appointments. What side effects may I notice from receiving this medicine? Side effects that you should report to your doctor or health care provider as soon as possible:  allergic reactions (skin rash, itching or   hives; swelling of the face, lips, or tongue)  bone pain  infection (fever, chills, cough, sore throat, pain or trouble passing urine)  jaw pain, especially after dental work  joint pain  kidney injury (trouble passing urine or change in the amount of urine)  low calcium levels (fast heartbeat; muscle cramps or pain; pain, tingling, or numbness in the hands or feet; seizures)  low red blood cell  counts (trouble breathing; feeling faint; lightheaded, falls; unusually weak or tired)  muscle pain  palpitations  redness, blistering, peeling, or loosening of the skin, including inside the mouth Side effects that usually do not require medical attention (report to your doctor or health care provider if they continue or are bothersome):  diarrhea  eye irritation, itching, or pain  fever  general ill feeling or flu-like symptoms  headache  increase in blood pressure  nausea  pain, redness, or irritation at site where injected  stomach pain  upset stomach This list may not describe all possible side effects. Call your doctor for medical advice about side effects. You may report side effects to FDA at 1-800-FDA-1088. Where should I keep my medicine? This drug is given in a hospital or clinic. It will not be stored at home. NOTE: This sheet is a summary. It may not cover all possible information. If you have questions about this medicine, talk to your doctor, pharmacist, or health care provider.  2021 Elsevier/Gold Standard (2019-02-28 11:46:18)   

## 2020-09-20 ENCOUNTER — Telehealth: Payer: Self-pay | Admitting: Nurse Practitioner

## 2020-09-20 ENCOUNTER — Encounter: Payer: Self-pay | Admitting: Hematology

## 2020-09-20 NOTE — Progress Notes (Signed)
Received call from patient regarding Zometa authorization and cost.  Advised patient per authorization notes, no [name] was required by insurance and I am not able to provide cost, however she can contact her insurance company to confirm no auth is required and how much they allow and where she is with her ded/OOP to determine what her remaining OOP would be for the year. She verbalized understanding.  She has my contact name and number for any additional financial questions or concerns.

## 2020-09-20 NOTE — Telephone Encounter (Signed)
Scheduled follow-up appointment per 4/27 los. Patient called back to cancel appointment due to infusion not covered by insurance. Patient stated she would speak to her PCP next week and would possibly call back to schedule.

## 2020-09-27 DIAGNOSIS — E559 Vitamin D deficiency, unspecified: Secondary | ICD-10-CM | POA: Diagnosis not present

## 2020-09-27 DIAGNOSIS — R1013 Epigastric pain: Secondary | ICD-10-CM | POA: Diagnosis not present

## 2020-09-27 DIAGNOSIS — R350 Frequency of micturition: Secondary | ICD-10-CM | POA: Diagnosis not present

## 2020-09-27 DIAGNOSIS — R7301 Impaired fasting glucose: Secondary | ICD-10-CM | POA: Diagnosis not present

## 2020-09-27 DIAGNOSIS — E785 Hyperlipidemia, unspecified: Secondary | ICD-10-CM | POA: Diagnosis not present

## 2020-09-27 DIAGNOSIS — R5383 Other fatigue: Secondary | ICD-10-CM | POA: Diagnosis not present

## 2020-09-27 DIAGNOSIS — Z1211 Encounter for screening for malignant neoplasm of colon: Secondary | ICD-10-CM | POA: Diagnosis not present

## 2020-10-01 ENCOUNTER — Encounter: Payer: Self-pay | Admitting: Hematology

## 2020-10-01 NOTE — Progress Notes (Signed)
Patient left voicemail on 09/28/20.  I sent follow up email to coding(Sheila), drug assistance(Laura), authorizations(Andreya), and Rockie Neighbours requesting follow up information regarding pricing.  Mickel Baas responded back and states no assistance available as generic is dispensed. Patient would like to know before she cancels her appointment. Her number was provided in email as she has spoken to [place] whom informed her code was covered by insurance but patient states when she contacted insurance, they state it is not covered.  She has my contact name and number for any additional financial questions or concerns.

## 2020-10-04 DIAGNOSIS — H5213 Myopia, bilateral: Secondary | ICD-10-CM | POA: Diagnosis not present

## 2020-10-04 DIAGNOSIS — H524 Presbyopia: Secondary | ICD-10-CM | POA: Diagnosis not present

## 2020-10-04 DIAGNOSIS — H52221 Regular astigmatism, right eye: Secondary | ICD-10-CM | POA: Diagnosis not present

## 2020-10-05 ENCOUNTER — Ambulatory Visit: Payer: BC Managed Care – PPO

## 2020-10-17 ENCOUNTER — Other Ambulatory Visit: Payer: Self-pay | Admitting: Hematology

## 2020-10-18 ENCOUNTER — Telehealth: Payer: Self-pay | Admitting: Hematology

## 2020-10-18 ENCOUNTER — Encounter: Payer: Self-pay | Admitting: Nurse Practitioner

## 2020-10-18 DIAGNOSIS — R1013 Epigastric pain: Secondary | ICD-10-CM | POA: Diagnosis not present

## 2020-10-18 NOTE — Telephone Encounter (Signed)
Scheduled appts per 5/25 sch msg. Pt said she would call back to confirm appt at later date.

## 2020-11-07 DIAGNOSIS — Z8042 Family history of malignant neoplasm of prostate: Secondary | ICD-10-CM | POA: Diagnosis not present

## 2020-11-07 DIAGNOSIS — C50912 Malignant neoplasm of unspecified site of left female breast: Secondary | ICD-10-CM | POA: Diagnosis not present

## 2020-11-07 DIAGNOSIS — Z803 Family history of malignant neoplasm of breast: Secondary | ICD-10-CM | POA: Diagnosis not present

## 2020-11-07 DIAGNOSIS — Z6835 Body mass index (BMI) 35.0-35.9, adult: Secondary | ICD-10-CM | POA: Diagnosis not present

## 2020-11-07 DIAGNOSIS — Z01419 Encounter for gynecological examination (general) (routine) without abnormal findings: Secondary | ICD-10-CM | POA: Diagnosis not present

## 2021-03-13 DIAGNOSIS — H40003 Preglaucoma, unspecified, bilateral: Secondary | ICD-10-CM | POA: Diagnosis not present

## 2021-03-13 DIAGNOSIS — Z961 Presence of intraocular lens: Secondary | ICD-10-CM | POA: Diagnosis not present

## 2021-03-18 ENCOUNTER — Other Ambulatory Visit: Payer: BC Managed Care – PPO

## 2021-03-18 ENCOUNTER — Ambulatory Visit: Payer: BC Managed Care – PPO | Admitting: Hematology

## 2021-03-23 IMAGING — MG DIGITAL DIAGNOSTIC BILAT W/ TOMO W/ CAD
8 of 13 series · 8 of 37 positions shown · non-contrast
Comparison: Previous exam(s).

CLINICAL DATA: 59-year-old female for annual follow-up. History of
LEFT breast cancer and lumpectomy in 3998.

EXAM:
DIGITAL DIAGNOSTIC BILATERAL MAMMOGRAM WITH CAD AND TOMO

[L MLO]
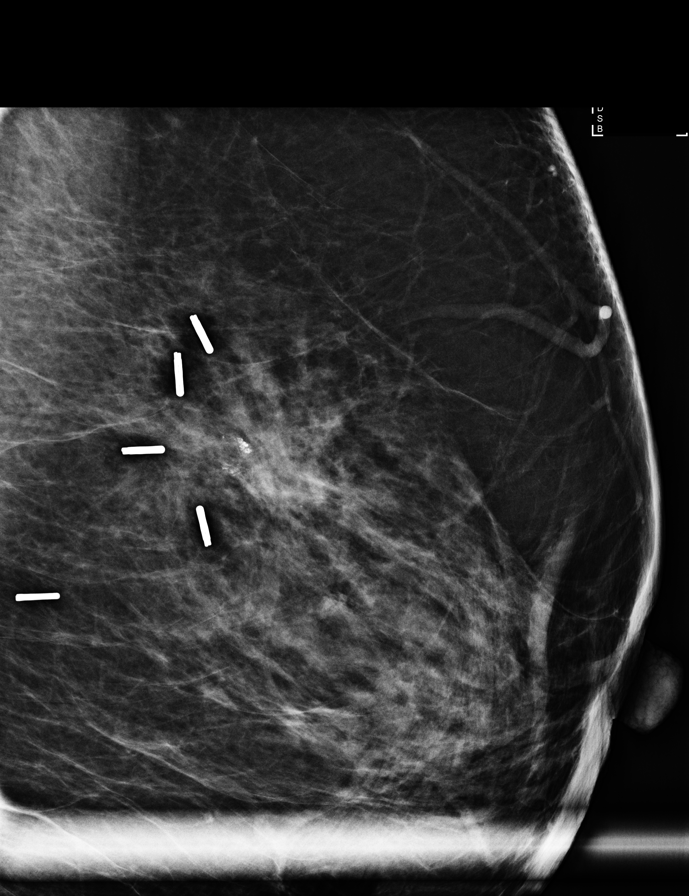

[L XCCL synth-2D]
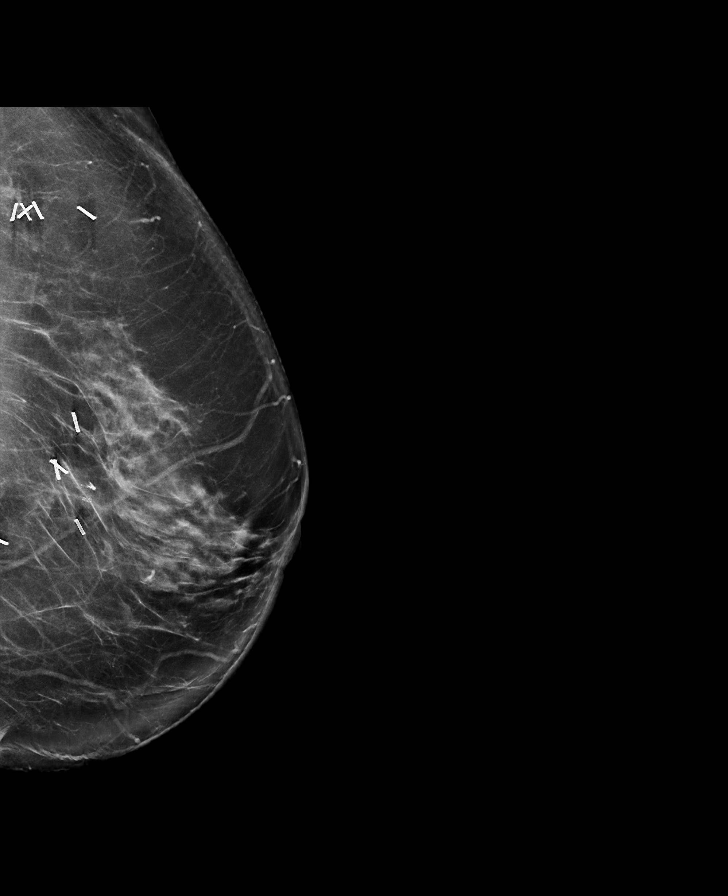

[R CC synth-2D]
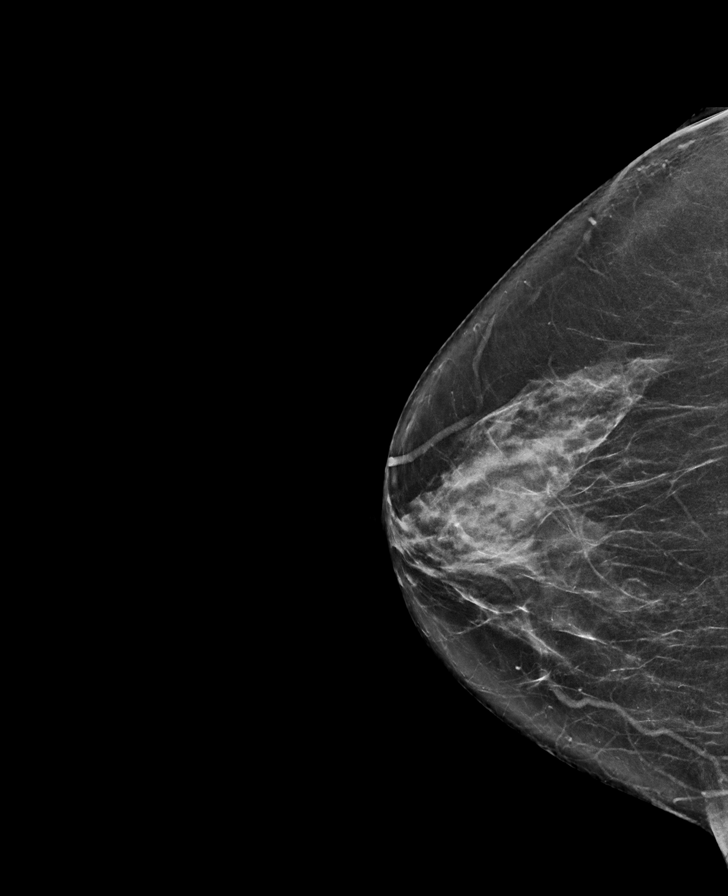

[R MLO synth-2D]
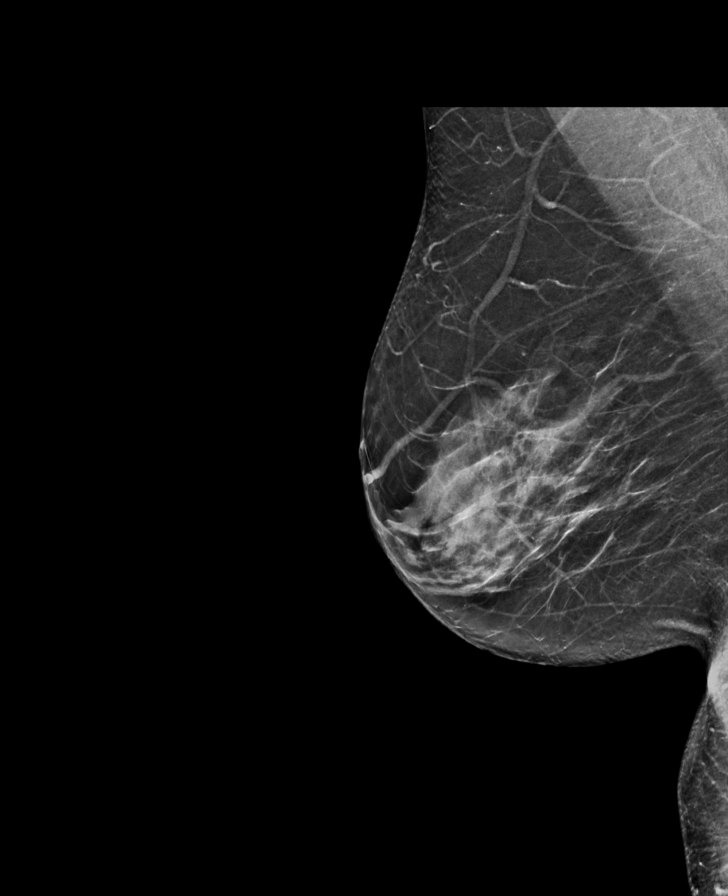

[L CC synth-2D (1 of 2)]
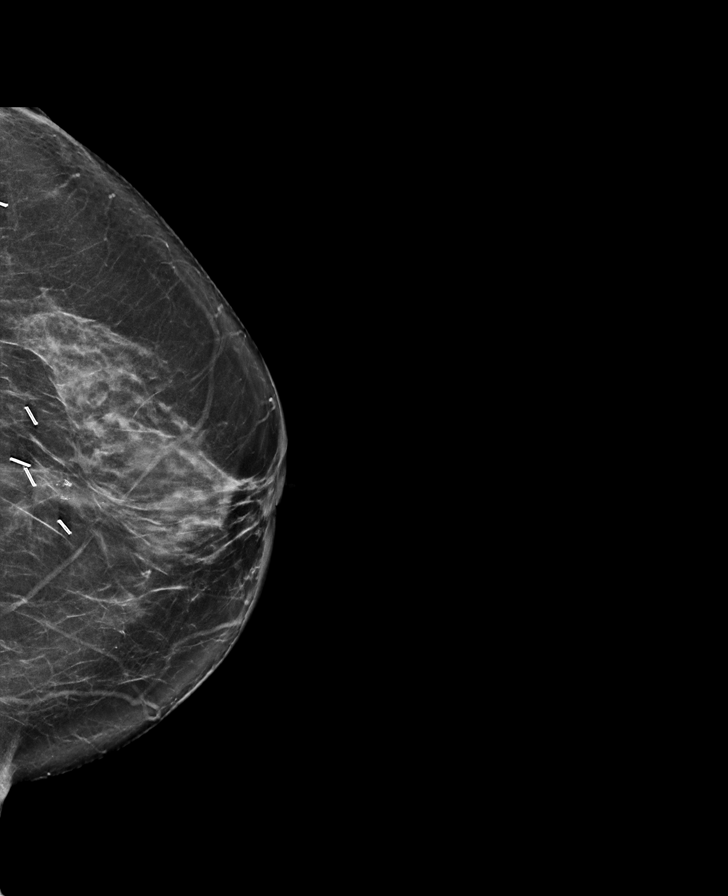

[L MLO synth-2D]
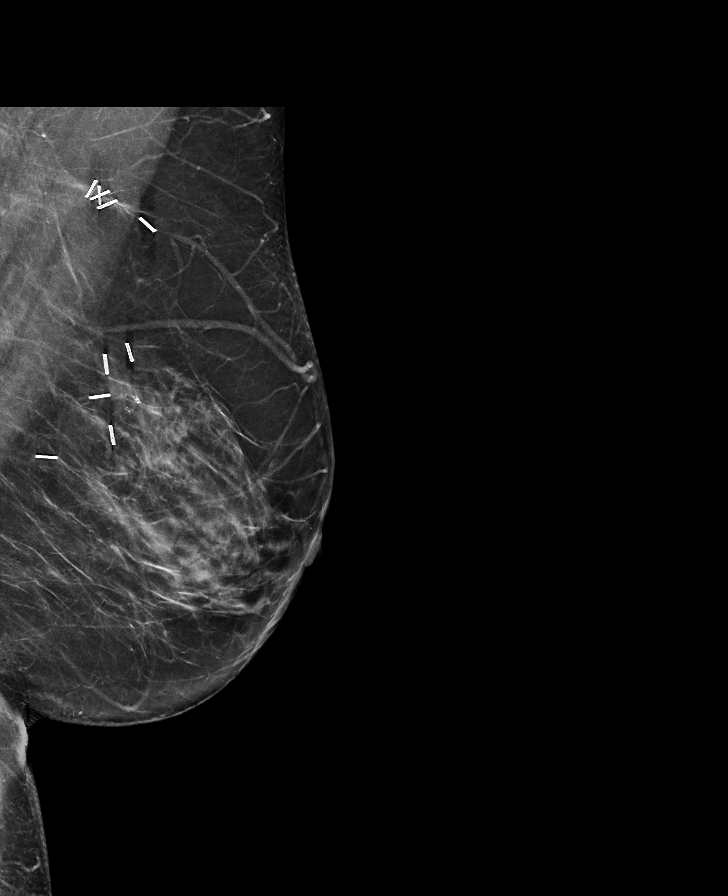

[L CC synth-2D (2 of 2)]
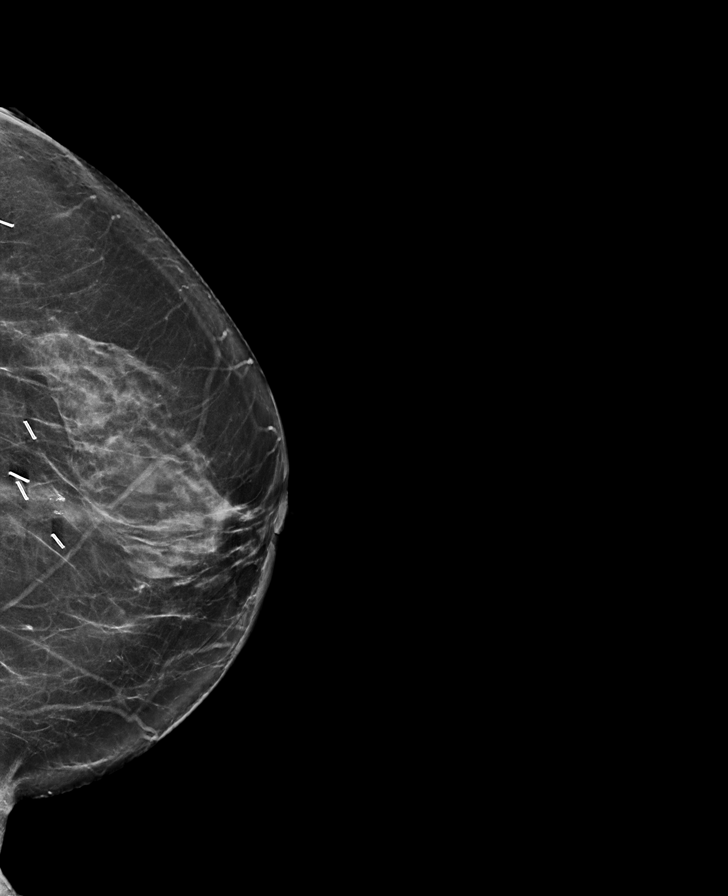

[R MLO tomo · tomo slice 35/70.0]
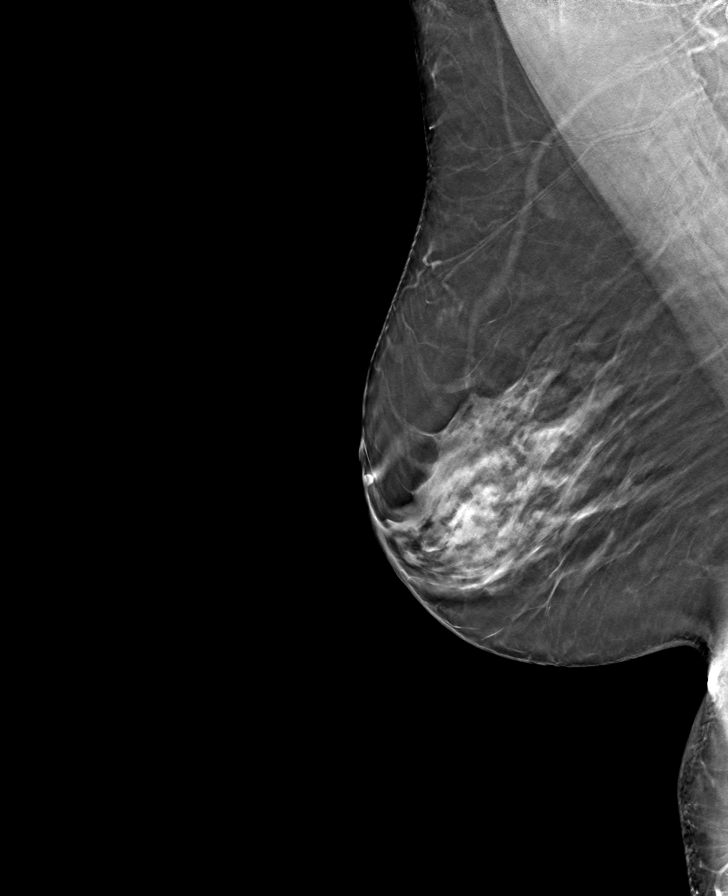

[8 of 37 positions shown; findings below may reference images not displayed]

ACR Breast Density Category c: The breast tissue is heterogeneously
dense, which may obscure small masses.
FINDINGS: 2D and 3D full field views of both breasts and a magnification view
of the lumpectomy site demonstrate no suspicious mass, nonsurgical
distortion or worrisome calcifications.

LEFT breast and axillary surgical changes are again noted.

Mammographic images were processed with CAD.
IMPRESSION: No evidence of breast malignancy.

RECOMMENDATION:
Bilateral screening mammogram in 1 year.

I have discussed the findings and recommendations with the patient.
If applicable, a reminder letter will be sent to the patient
regarding the next appointment.

BI-RADS CATEGORY  2: Benign.

## 2021-03-26 ENCOUNTER — Other Ambulatory Visit: Payer: Self-pay

## 2021-03-26 ENCOUNTER — Inpatient Hospital Stay: Payer: BC Managed Care – PPO | Attending: Hematology

## 2021-03-26 ENCOUNTER — Inpatient Hospital Stay (HOSPITAL_BASED_OUTPATIENT_CLINIC_OR_DEPARTMENT_OTHER): Payer: BC Managed Care – PPO | Admitting: Hematology

## 2021-03-26 VITALS — BP 139/77 | HR 82 | Temp 98.7°F | Resp 17 | Wt 208.8 lb

## 2021-03-26 DIAGNOSIS — Z923 Personal history of irradiation: Secondary | ICD-10-CM | POA: Diagnosis not present

## 2021-03-26 DIAGNOSIS — M8580 Other specified disorders of bone density and structure, unspecified site: Secondary | ICD-10-CM | POA: Diagnosis not present

## 2021-03-26 DIAGNOSIS — Z79899 Other long term (current) drug therapy: Secondary | ICD-10-CM | POA: Diagnosis not present

## 2021-03-26 DIAGNOSIS — Z79811 Long term (current) use of aromatase inhibitors: Secondary | ICD-10-CM | POA: Insufficient documentation

## 2021-03-26 DIAGNOSIS — C50212 Malignant neoplasm of upper-inner quadrant of left female breast: Secondary | ICD-10-CM

## 2021-03-26 DIAGNOSIS — Z170 Estrogen receptor positive status [ER+]: Secondary | ICD-10-CM | POA: Insufficient documentation

## 2021-03-26 DIAGNOSIS — Z1231 Encounter for screening mammogram for malignant neoplasm of breast: Secondary | ICD-10-CM

## 2021-03-26 LAB — CBC WITH DIFFERENTIAL (CANCER CENTER ONLY)
Abs Immature Granulocytes: 0.02 10*3/uL (ref 0.00–0.07)
Basophils Absolute: 0 10*3/uL (ref 0.0–0.1)
Basophils Relative: 0 %
Eosinophils Absolute: 0.2 10*3/uL (ref 0.0–0.5)
Eosinophils Relative: 2 %
HCT: 39.6 % (ref 36.0–46.0)
Hemoglobin: 13.1 g/dL (ref 12.0–15.0)
Immature Granulocytes: 0 %
Lymphocytes Relative: 23 %
Lymphs Abs: 1.8 10*3/uL (ref 0.7–4.0)
MCH: 30.2 pg (ref 26.0–34.0)
MCHC: 33.1 g/dL (ref 30.0–36.0)
MCV: 91.2 fL (ref 80.0–100.0)
Monocytes Absolute: 0.6 10*3/uL (ref 0.1–1.0)
Monocytes Relative: 8 %
Neutro Abs: 5.1 10*3/uL (ref 1.7–7.7)
Neutrophils Relative %: 67 %
Platelet Count: 167 10*3/uL (ref 150–400)
RBC: 4.34 MIL/uL (ref 3.87–5.11)
RDW: 12.5 % (ref 11.5–15.5)
WBC Count: 7.7 10*3/uL (ref 4.0–10.5)
nRBC: 0 % (ref 0.0–0.2)

## 2021-03-26 LAB — CMP (CANCER CENTER ONLY)
ALT: 33 U/L (ref 0–44)
AST: 21 U/L (ref 15–41)
Albumin: 4 g/dL (ref 3.5–5.0)
Alkaline Phosphatase: 72 U/L (ref 38–126)
Anion gap: 9 (ref 5–15)
BUN: 14 mg/dL (ref 8–23)
CO2: 26 mmol/L (ref 22–32)
Calcium: 9.6 mg/dL (ref 8.9–10.3)
Chloride: 104 mmol/L (ref 98–111)
Creatinine: 0.77 mg/dL (ref 0.44–1.00)
GFR, Estimated: >60 mL/min (ref >60)
Glucose, Bld: 87 mg/dL (ref 70–99)
Potassium: 4 mmol/L (ref 3.5–5.1)
Sodium: 139 mmol/L (ref 135–145)
Total Bilirubin: 0.7 mg/dL (ref 0.3–1.2)
Total Protein: 7.7 g/dL (ref 6.5–8.1)

## 2021-03-26 MED ORDER — ALENDRONATE SODIUM 70 MG PO TABS: 70.0000 mg | ORAL_TABLET | ORAL | 5 refills | Status: DC

## 2021-03-26 NOTE — Progress Notes (Signed)
[facility]   Telephone:([phone]) [phone] Fax:(336) [phone]   Clinic Follow up Note   Patient Care Team: [name], MD as PCP - General (Family Medicine) Erroll Luna, MD as Consulting Physician (General Surgery) Truitt Merle, MD as Consulting Physician (Hematology) Gery Pray, MD as Consulting Physician (Radiation Oncology) Gardenia Phlegm, NP as Nurse Practitioner (Hematology and Oncology)  Date of Service:  03/26/2021  CHIEF COMPLAINT: f/u of left breast cancer  CURRENT THERAPY:  Anastrozole 32m daily started in 05/2017. She held for 3-4 weeks in October 2019 due to fatigue and restarted after.   ASSESSMENT & PLAN:  Shelby KIRSHNERis a 60y.o. female with   1. 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 S/p left lumpectomy and adjuvant radiation.  Given the small, low-grade tumor she did not need adjuvant chemotherapy -She started antiestrogen therapy with Anastrozole in 05/2017. She held for 3-4 weeks in October 2019 due to fatigue and restarted after. She has been tolerating well.  -05/2020 mammogram is negative. She will move to screening mammograms now. She previous had these done with her GYN. [facility] -she is clinically doing well. Labs reviewed, all WNL. Physical exam is unremarkable. There is no clinical concern for recurrence. -Continue surveillance and anastrozole.    2. Osteopenia -DEXA from 07/08/17 revealed a T score of -1.8 at the left femur neck, worsened to -2.0 on 06/18/20. FRAX shows 9% risk of major osteoporotic fracture, and 1% risk of hip fracture -Continue taking oral Ca and Vit D. I encouraged her to be active with walking.  -Given worsening osteopenia, we discussed starting medication with bisphosphonate zometa. She agreed, but she was given conflicting  information regarding whether or not her insurance will cover it and did not proceed. -I recommend oral alendronate 746mweekly, I reviewed AEs, she agrees to proceed, she was cleared by her dentist.    PLAN: -continue anastrozole  -I called in alendronate, she will start next week  -mammogram in 05/2021 -lab and f/u with NP Lacie in 6 months   No problem-specific Assessment & Plan notes found for this encounter.   SUMMARY OF ONCOLOGIC HISTORY: Oncology History Overview Note  Cancer Staging Malignant neoplasm of upper-inner quadrant of left breast in female, estrogen receptor positive (HCVanStaging form: Breast, AJCC 8th Edition - Clinical stage from 02/10/2017: Stage Unknown (cTX, cN0, cM0, G1, ER: Positive, PR: Negative, HER2: Negative) - Signed by FeTruitt MerleMD on 02/17/2017     Malignant neoplasm of upper-inner quadrant of left breast in female, estrogen receptor positive ([facility] 01/23/2017 Mammogram   DIAGNOSTIC MAMMOGRAM AND US[place]oreaMPRESSION: Small area of architectural distortion in the posterior upper left breast at approximately 11 o'clock. Tissue sampling is recommended. US[place]ore[place] left breast and axilla was negative    02/10/2017 Receptors her2   Estrogen Receptor: 90%, POSITIVE, STRONG STAINING INTENSITY Progesterone Receptor: 0%, NEGATIVE HER2 NOT AMPLIFIED  Proliferation Marker Ki67: 2%    02/10/2017 Initial Biopsy   Breast, left, needle core biopsy, upper inner - INVASIVE DUCTAL CARCINOMA. GRADE 1   02/10/2017 Initial Diagnosis   Malignant neoplasm of upper-inner quadrant of left breast in female, estrogen receptor positive ([facility]  03/24/2017 Surgery   Left lumpectomy: IDC, g1, 0.5cm, margins negative, 5 SLN negative, ER positive, PR/HER-2 negative, Ki-67 2%.     05/06/2017 -  06/04/2017 Radiation Therapy   (Dr. Sondra Come): 1. Left breast// 40.05 Gy in 15 fractions.  2. Boost // 10 Gy in 5 fractions.     05/2017 -  Anti-estrogen oral therapy   Anastrozole 65m daily  started in 05/2017. She held for 3-4 weeks in October 2019 due to fatigue and restarted after.    01/06/2018 Mammogram   01/06/2018 Mammogram  IMPRESSION: No evidence of malignancy.      INTERVAL HISTORY:  [name] is here for a follow up of breast cancer. She was last seen by NP Lacie on 09/19/20. She presents to the clinic alone. She reports she is gaining weight and her hair is thinning.   All other systems were reviewed with the patient and are negative.  MEDICAL HISTORY:  Past Medical History:  Diagnosis Date   Breast cancer ([facility] 02/10/2017   L BREAST IDC   Breast cancer, left ([facility] 01/2017   Glaucoma    no current med.   History of radiation therapy 05/06/17-06/04/17   left breast 40.05 Gy in 15 fractions, left breast boost 10 Gy in 5 fractions   PONV (postoperative nausea and vomiting)     SURGICAL HISTORY: Past Surgical History:  Procedure Laterality Date   BREAST BIOPSY Left 02/10/2017   IDC   BREAST LUMPECTOMY Left 03/23/2017   invasive ductal ca   BREAST LUMPECTOMY WITH RADIOACTIVE SEED AND SENTINEL LYMPH NODE BIOPSY Left 03/24/2017   Procedure: BREAST PARTIAL MASTECTOMY WITH RADIOACTIVE SEED AND SENTINEL LYMPH NODE BIOPSY;  Surgeon: CErroll Luna MD;  [facility]  Service: General;  Laterality: Left;   CATARACT EXTRACTION W/ INTRAOCULAR LENS IMPLANT      I have reviewed the social history and family history with the patient and they are unchanged from previous note.  ALLERGIES:  is allergic to latex.  MEDICATIONS:  Current Outpatient Medications  Medication Sig Dispense Refill   alendronate (FOSAMAX) 70 MG tablet Take 1 tablet (70 mg total) by mouth once a week. Take with a full glass of water on an empty stomach. 4 tablet 5   anastrozole (ARIMIDEX) 1 MG tablet TAKE 1 TABLET BY MOUTH EVERY DAY 90 tablet 1   calcium carbonate (CALCIUM 600) 600 MG TABS tablet Take 1 tablet (600 mg total) by mouth 2 (two) times daily with a  meal. 60 tablet    cholecalciferol (VITAMIN D) 1000 units tablet Take 1 tablet (1,000 Units total) by mouth daily.     Multiple Vitamin (MULTI-VITAMIN) tablet Take 1 tablet by mouth daily.     No current facility-administered medications for this visit.    PHYSICAL EXAMINATION: ECOG PERFORMANCE STATUS: 0 - Asymptomatic  Vitals:   03/26/21 1109  BP: 139/77  Pulse: 82  Resp: 17  Temp: 98.7 F (37.1 C)  SpO2: 100%   Wt Readings from Last 3 Encounters:  03/26/21 208 lb 12.8 oz (94.7 kg)  09/19/20 205 lb 6.4 oz (93.2 kg)  12/19/19 195 lb 6.4 oz (88.6 kg)     GENERAL:alert, no distress and comfortable SKIN: skin color, texture, turgor are normal, no rashes or significant lesions EYES: normal, Conjunctiva are pink and non-injected, sclera clear  NECK: supple, thyroid normal size, non-tender, without nodularity LYMPH:  no palpable lymphadenopathy in the cervical, axillary  LUNGS: clear to auscultation and percussion with normal breathing effort HEART: regular rate & rhythm and no murmurs and no lower extremity edema ABDOMEN:abdomen soft, non-tender and normal bowel sounds Musculoskeletal:no cyanosis of digits and no clubbing  NEURO: alert & oriented x 3 with fluent speech, no focal motor/sensory deficits BREAST: minimal scar tissue present. No palpable mass, nodules or adenopathy bilaterally. Breast exam benign.   LABORATORY DATA:  I have reviewed the data as listed CBC Latest Ref Rng & Units 03/26/2021 09/19/2020 12/19/2019  WBC 4.0 - 10.5 K/uL 7.7 6.2 5.8  Hemoglobin 12.0 - 15.0 g/dL 13.1 13.6 13.0  Hematocrit 36.0 - 46.0 % 39.6 41.1 39.0  Platelets 150 - 400 K/uL 167 171 158     CMP Latest Ref Rng & Units 03/26/2021 09/19/2020 12/19/2019  Glucose 70 - 99 mg/dL 87 97 99  BUN 8 - 23 mg/dL '14 18 18  ' Creatinine 0.44 - 1.00 mg/dL 0.77 0.78 0.78  Sodium 135 - 145 mmol/L 139 140 138  Potassium 3.5 - 5.1 mmol/L 4.0 4.7 4.3  Chloride 98 - 111 mmol/L 104 104 105  CO2 22 - 32 mmol/L  '26 24 24  ' Calcium 8.9 - 10.3 mg/dL 9.6 9.4 10.1  Total Protein 6.5 - 8.1 g/dL 7.7 7.9 7.5  Total Bilirubin 0.3 - 1.2 mg/dL 0.7 0.5 0.5  Alkaline Phos 38 - 126 U/L 72 63 62  AST 15 - 41 U/L '21 26 17  ' ALT 0 - 44 U/L 33 37 21      RADIOGRAPHIC STUDIES: I have personally reviewed the radiological images as listed and agreed with the findings in the report. No results found.    Orders Placed This Encounter  Procedures   MM Digital Screening    Standing Status:   Future    Standing Expiration Date:   03/26/2022    Order Specific Question:   Reason for Exam (SYMPTOM  OR DIAGNOSIS REQUIRED)    Answer:   screening    Order Specific Question:   Preferred imaging location?    Answer:   [facility]   All questions were answered. The patient knows to call the clinic with any problems, questions or concerns. No barriers to learning was detected. The total time spent in the appointment was 30 minutes.     Truitt Merle, MD 03/26/2021   I, Wilburn Mylar, am acting as scribe for Truitt Merle, MD.   I have reviewed the above documentation for accuracy and completeness, and I agree with the above.

## 2021-03-27 ENCOUNTER — Telehealth: Payer: Self-pay | Admitting: Hematology

## 2021-03-27 NOTE — Telephone Encounter (Signed)
Left message with follow-up appointment per 11/1 los. 

## 2021-04-16 ENCOUNTER — Other Ambulatory Visit: Payer: Self-pay | Admitting: Nurse Practitioner

## 2021-05-15 ENCOUNTER — Other Ambulatory Visit: Payer: Self-pay

## 2021-05-15 NOTE — Progress Notes (Signed)
Pt called stating she stopped taking the Alendronate d/t side effects.  Pt stated she is planning on stopping the Anastrozole as well d/t side effects as well.  Informed pt that I would make Dr. Burr Medico aware of the pt's choices.

## 2021-06-18 ENCOUNTER — Ambulatory Visit
Admission: RE | Admit: 2021-06-18 | Discharge: 2021-06-18 | Disposition: A | Discharge status: Home / Self Care | Payer: BC Managed Care – PPO | Source: Ambulatory Visit | Attending: Hematology | Admitting: Hematology

## 2021-06-18 DIAGNOSIS — Z1231 Encounter for screening mammogram for malignant neoplasm of breast: Secondary | ICD-10-CM

## 2021-07-09 ENCOUNTER — Ambulatory Visit: Payer: BC Managed Care – PPO | Admitting: Family

## 2021-09-22 NOTE — Progress Notes (Signed)
?[place]   ?[phone] [phone]   ?Clinic Follow up Note  ? ?Patient Care Team: ?Shelby Resides, MD (Inactive) as PCP - General (Family Medicine) ?Shelby Luna, MD as Consulting Physician (General Surgery) ?Shelby Merle, MD as Consulting Physician (Hematology) ?Shelby Pray, MD as Consulting Physician (Radiation Oncology) ?Shelby Phlegm, NP as Nurse Practitioner (Hematology and Oncology) ?09/23/2021 ? ?CHIEF COMPLAINT: Follow up left breast cancer  ? ?SUMMARY OF ONCOLOGIC HISTORY: ?Oncology History Overview Note  ?Cancer Staging ?Malignant neoplasm of upper-inner quadrant of left breast in female, estrogen receptor positive ([place]) ?Staging form: Breast, AJCC 8th Edition ?- Clinical stage from 02/10/2017: Stage Unknown (cTX, cN0, cM0, G1, ER: Positive, PR: Negative, HER2: Negative) - Signed by Shelby Merle, MD on 02/17/2017 ? ? ?  ?Malignant neoplasm of upper-inner quadrant of left breast in female, estrogen receptor positive ([place])  ?01/23/2017 Mammogram  ? DIAGNOSTIC MAMMOGRAM AND [place] ?IMPRESSION: ?Small area of architectural distortion in the posterior upper left ?breast at approximately 11 o'clock. Tissue sampling is recommended. ?[place] of left breast and axilla was negative  ? ?  ?02/10/2017 Receptors her2  ? Estrogen Receptor: 90%, POSITIVE, STRONG STAINING INTENSITY ?Progesterone Receptor: 0%, NEGATIVE ?HER2 NOT AMPLIFIED  ?Proliferation Marker Ki67: 2% ? ? ?  ?02/10/2017 Initial Biopsy  ? Breast, left, needle core biopsy, upper inner ?- INVASIVE DUCTAL CARCINOMA. GRADE 1 ? ?  ?02/10/2017 Initial Diagnosis  ? Malignant neoplasm of upper-inner quadrant of left breast in female, estrogen receptor positive ([place]) ? ?  ?03/24/2017 Surgery  ? Left lumpectomy: IDC, g1, 0.5cm, margins negative, 5 SLN negative, ER positive, PR/HER-2 negative, Ki-67 2%.   ? ?  ?05/06/2017 - 06/04/2017 Radiation Therapy  ? (Dr. Sondra Come): 1. Left breast// 40.05 Gy in 15 fractions.  2. Boost // 10 Gy in  5 fractions. ? ? ?  ?05/2017 -  Anti-estrogen oral therapy  ? Anastrozole 41m daily started in 05/2017. She held for 3-4 weeks in October 2019 due to fatigue and restarted after.  ?  ?01/06/2018 Mammogram  ? 01/06/2018 Mammogram ? IMPRESSION: ?No evidence of malignancy. ? ?  ? ? ?CURRENT THERAPY: Anastrozole 171mdaily started in 05/2017. She held for 3-4 weeks in October 2019 due to fatigue and restarted after.  ? ?INTERVAL HISTORY: Ms. FlGuioneturns for follow up as scheduled. Last seen by Dr. FeBurr Medico1/1/22 and was prescribed Fosamax for osteopenia.  She spoke to our nurse 05/15/2021 and reported that she stopped taking Fosamax due to hair loss and planned to stop anastrozole as well.  Mammogram 06/18/21 was negative; breast density cat C.  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  She is going to try to lose weight now.  Denies breast concerns such as new lump/mass, nipple discharge or inversion, or skin change.  She has a soreness in the left elbow, no other bone pain.  She is scheduled for routine PCP visit tomorrow. ? ?All other systems were reviewed with the patient and are negative. ? ?MEDICAL HISTORY:  ?Past Medical History:  ?Diagnosis Date  ? [place]  ? L BREAST IDC  ? Breast cancer, [place] ([facility]/2018  ? Glaucoma   ? no current med.  ? History of radiation therapy 05/06/17-06/04/17  ? left breast 40.05 Gy in 15 fractions, left breast boost 10 Gy in 5 fractions  ? PONV (postoperative nausea and vomiting)   ? ? ?  SURGICAL HISTORY: ?Past Surgical History:  ?Procedure Laterality Date  ? BREAST BIOPSY Left 02/10/2017  ? IDC  ? BREAST LUMPECTOMY Left 03/23/2017  ? invasive ductal ca  ? BREAST LUMPECTOMY WITH RADIOACTIVE SEED AND SENTINEL LYMPH NODE BIOPSY Left 03/24/2017  ? Procedure: BREAST PARTIAL MASTECTOMY WITH RADIOACTIVE SEED AND SENTINEL LYMPH NODE BIOPSY;  Surgeon: Shelby Luna, MD;  Location: [facility];  Service: General;  Laterality: Left;  ? CATARACT EXTRACTION W/ INTRAOCULAR LENS IMPLANT    ? ? ?I have reviewed the social history and family history with the patient and they are unchanged from previous note. ? ?ALLERGIES:  is allergic to latex. ? ?MEDICATIONS:  ?Current Outpatient Medications  ?Medication Sig Dispense Refill  ? alendronate (FOSAMAX) 70 MG tablet Take 1 tablet (70 mg total) by mouth once a week. Take with a full glass of water on an empty stomach. 4 tablet 5  ? anastrozole (ARIMIDEX) 1 MG tablet TAKE 1 TABLET BY MOUTH EVERY DAY 90 tablet 1  ? calcium carbonate (CALCIUM 600) 600 MG TABS tablet Take 1 tablet (600 mg total) by mouth 2 (two) times daily with a meal. 60 tablet   ? cholecalciferol (VITAMIN D) 1000 units tablet Take 1 tablet (1,000 Units total) by mouth daily.    ? Multiple Vitamin (MULTI-VITAMIN) tablet Take 1 tablet by mouth daily.    ? ?No current facility-administered medications for this visit.  ? ? ?PHYSICAL EXAMINATION: ?ECOG PERFORMANCE STATUS: 0 - Asymptomatic ? ?Vitals:  ? 09/23/21 1034  ?BP: 136/82  ?Resp: 18  ?Temp: 97.9 ?F (36.6 ?C)  ? ?Filed Weights  ? 09/23/21 1034  ?Weight: 210 lb 8 oz (95.5 kg)  ? ? ?GENERAL:alert, no distress and comfortable ?SKIN: No rash ?EYES: sclera clear ?NECK: Without mass ?LYMPH:  no palpable cervical or supraclavicular lymphadenopathy ?LUNGS: clear with normal breathing effort ?HEART: regular rate & rhythm, no lower extremity edema ?ABDOMEN:abdomen soft, non-tender and normal bowel sounds ?Musculoskeletal: No TTP ?NEURO: alert & oriented x 3 with fluent speech, no focal motor/sensory deficits ?Breast exam: Breasts are symmetrical without nipple discharge or inversion.  S/p left lumpectomy, incisions completely healed with minimal scar tissue.  No palpable mass or nodularity in either breast or axilla that I could appreciate. ? ?LABORATORY DATA:  ?I have reviewed the data as listed ? ?  Latest Ref Rng & Units 09/23/2021  ?  9:51 AM  03/26/2021  ? 10:52 AM 09/19/2020  ? 10:09 AM  ?CBC  ?WBC 4.0 - 10.5 K/uL 7.3   7.7   6.2    ?Hemoglobin 12.0 - 15.0 g/dL 13.5   13.1   13.6    ?Hematocrit 36.0 - 46.0 % 40.7   39.6   41.1    ?Platelets 150 - 400 K/uL 165   167   171    ? ? ? ? ?  Latest Ref Rng & Units 09/23/2021  ?  9:51 AM 03/26/2021  ? 10:52 AM 09/19/2020  ? 10:09 AM  ?CMP  ?Glucose 70 - 99 mg/dL 110   87   97    ?BUN 8 - 23 mg/dL '17   14   18    ' ?Creatinine 0.44 - 1.00 mg/dL 0.78   0.77   0.78    ?Sodium 135 - 145 mmol/L 137   139   140    ?Potassium 3.5 - 5.1 mmol/L 4.1   4.0   4.7    ?Chloride 98 - 111 mmol/L 104  104   104    ?CO2 22 - 32 mmol/L '27   26   24    ' ?Calcium 8.9 - 10.3 mg/dL 9.4   9.6   9.4    ?Total Protein 6.5 - 8.1 g/dL 7.5   7.7   7.9    ?Total Bilirubin 0.3 - 1.2 mg/dL 0.6   0.7   0.5    ?Alkaline Phos 38 - 126 U/L 57   72   63    ?AST 15 - 41 U/L '20   21   26    ' ?ALT 0 - 44 U/L 26   33   37    ? ? ? ? ?RADIOGRAPHIC STUDIES: ?I have personally reviewed the radiological images as listed and agreed with the findings in the report. ?No results found.  ? ?ASSESSMENT & PLAN: [name] is a 61 y.o. female with  ?  ?  ?1. 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 S/p left lumpectomy and adjuvant radiation.  Given the small, low-grade tumor she did not need adjuvant chemotherapy ?-She started antiestrogen therapy with Anastrozole in 05/2017. She held for 3-4 weeks in October 2019 due to fatigue and restarted after.  ?-[name] is clinically doing well.  She stopped anastrozole early 2023 due to osteopenia and fatigue.  She feels better off AI.  She completed 4 years of therapy. ?-Breast exam is benign, CBC and CMP are unremarkable.  Mammogram 05/2021 is negative.  Overall there is no clinical concern for breast cancer recurrence. ?-She is nearing 5 years from initial diagnosis, the  recurrence risk has decreased.  Continue surveillance ?-Next screening 3D mammogram 05/2022 ?-Follow-up in 1 year, then likely back to PCP for surveillance ?  ?2. Osteopenia ?-DEXA from 07/08/17 revealed a T sco

## 2021-09-23 ENCOUNTER — Other Ambulatory Visit: Payer: Self-pay

## 2021-09-23 ENCOUNTER — Inpatient Hospital Stay: Payer: BC Managed Care – PPO | Attending: Nurse Practitioner | Admitting: Nurse Practitioner

## 2021-09-23 ENCOUNTER — Inpatient Hospital Stay: Payer: BC Managed Care – PPO

## 2021-09-23 ENCOUNTER — Encounter: Payer: Self-pay | Admitting: Nurse Practitioner

## 2021-09-23 VITALS — BP 136/82 | Temp 97.9°F | Resp 18 | Ht 64.5 in | Wt 210.5 lb

## 2021-09-23 DIAGNOSIS — M8580 Other specified disorders of bone density and structure, unspecified site: Secondary | ICD-10-CM | POA: Diagnosis not present

## 2021-09-23 DIAGNOSIS — Z79811 Long term (current) use of aromatase inhibitors: Secondary | ICD-10-CM | POA: Insufficient documentation

## 2021-09-23 DIAGNOSIS — M8589 Other specified disorders of bone density and structure, multiple sites: Secondary | ICD-10-CM

## 2021-09-23 DIAGNOSIS — Z170 Estrogen receptor positive status [ER+]: Secondary | ICD-10-CM | POA: Insufficient documentation

## 2021-09-23 DIAGNOSIS — Z79899 Other long term (current) drug therapy: Secondary | ICD-10-CM | POA: Diagnosis not present

## 2021-09-23 DIAGNOSIS — Z923 Personal history of irradiation: Secondary | ICD-10-CM | POA: Insufficient documentation

## 2021-09-23 DIAGNOSIS — C50212 Malignant neoplasm of upper-inner quadrant of left female breast: Secondary | ICD-10-CM | POA: Diagnosis not present

## 2021-09-23 LAB — CBC WITH DIFFERENTIAL (CANCER CENTER ONLY)
Abs Immature Granulocytes: 0.02 10*3/uL (ref 0.00–0.07)
Basophils Absolute: 0 10*3/uL (ref 0.0–0.1)
Basophils Relative: 1 %
Eosinophils Absolute: 0.2 10*3/uL (ref 0.0–0.5)
Eosinophils Relative: 2 %
HCT: 40.7 % (ref 36.0–46.0)
Hemoglobin: 13.5 g/dL (ref 12.0–15.0)
Immature Granulocytes: 0 %
Lymphocytes Relative: 29 %
Lymphs Abs: 2.1 10*3/uL (ref 0.7–4.0)
MCH: 30.3 pg (ref 26.0–34.0)
MCHC: 33.2 g/dL (ref 30.0–36.0)
MCV: 91.5 fL (ref 80.0–100.0)
Monocytes Absolute: 0.6 10*3/uL (ref 0.1–1.0)
Monocytes Relative: 8 %
Neutro Abs: 4.3 10*3/uL (ref 1.7–7.7)
Neutrophils Relative %: 60 %
Platelet Count: 165 10*3/uL (ref 150–400)
RBC: 4.45 MIL/uL (ref 3.87–5.11)
RDW: 12.6 % (ref 11.5–15.5)
WBC Count: 7.3 10*3/uL (ref 4.0–10.5)
nRBC: 0 % (ref 0.0–0.2)

## 2021-09-23 LAB — CMP (CANCER CENTER ONLY)
ALT: 26 U/L (ref 0–44)
AST: 20 U/L (ref 15–41)
Albumin: 4.5 g/dL (ref 3.5–5.0)
Alkaline Phosphatase: 57 U/L (ref 38–126)
Anion gap: 6 (ref 5–15)
BUN: 17 mg/dL (ref 8–23)
CO2: 27 mmol/L (ref 22–32)
Calcium: 9.4 mg/dL (ref 8.9–10.3)
Chloride: 104 mmol/L (ref 98–111)
Creatinine: 0.78 mg/dL (ref 0.44–1.00)
GFR, Estimated: >60 mL/min (ref >60)
Glucose, Bld: 110 mg/dL — ABNORMAL HIGH (ref 70–99)
Potassium: 4.1 mmol/L (ref 3.5–5.1)
Sodium: 137 mmol/L (ref 135–145)
Total Bilirubin: 0.6 mg/dL (ref 0.3–1.2)
Total Protein: 7.5 g/dL (ref 6.5–8.1)

## 2021-09-24 ENCOUNTER — Encounter: Payer: Self-pay | Admitting: Family

## 2021-09-24 ENCOUNTER — Ambulatory Visit (INDEPENDENT_AMBULATORY_CARE_PROVIDER_SITE_OTHER): Payer: BC Managed Care – PPO | Admitting: Family

## 2021-09-24 ENCOUNTER — Telehealth: Payer: Self-pay | Admitting: Hematology

## 2021-09-24 VITALS — BP 116/72 | HR 83 | Temp 98.3°F | Resp 18 | Ht 64.5 in | Wt 211.0 lb

## 2021-09-24 DIAGNOSIS — Z170 Estrogen receptor positive status [ER+]: Secondary | ICD-10-CM

## 2021-09-24 DIAGNOSIS — C50212 Malignant neoplasm of upper-inner quadrant of left female breast: Secondary | ICD-10-CM | POA: Diagnosis not present

## 2021-09-24 DIAGNOSIS — M8589 Other specified disorders of bone density and structure, multiple sites: Secondary | ICD-10-CM

## 2021-09-24 DIAGNOSIS — Z1211 Encounter for screening for malignant neoplasm of colon: Secondary | ICD-10-CM

## 2021-09-24 DIAGNOSIS — R42 Dizziness and giddiness: Secondary | ICD-10-CM

## 2021-09-24 MED ORDER — MECLIZINE HCL 25 MG PO TABS: 25.0000 mg | ORAL_TABLET | Freq: Three times a day (TID) | ORAL | 0 refills | Status: DC | PRN

## 2021-09-24 MED ORDER — SCOPOLAMINE 1 MG/3DAYS TD PT72: 1.0000 | MEDICATED_PATCH | TRANSDERMAL | 1 refills | Status: DC

## 2021-09-24 NOTE — Telephone Encounter (Signed)
Attempted to schedule follow-up appointment per 5/1 los. Patient opted to call back to schedule closer to expected date. ?

## 2021-09-24 NOTE — Progress Notes (Signed)
?[name] is a 61 y.o. female with the following history as recorded in EpicCare:  ?Patient Active Problem List  ? Diagnosis Date Noted  ? Osteopenia of multiple sites 09/19/2020  ? Estrogen deficiency 06/04/2017  ? Malignant neoplasm of upper-inner quadrant of left breast in female, estrogen receptor positive ([place]) 02/17/2017  ?  ?Current Outpatient Medications  ?Medication Sig Dispense Refill  ? calcium carbonate (CALCIUM 600) 600 MG TABS tablet Take 1 tablet (600 mg total) by mouth 2 (two) times daily with a meal. 60 tablet   ? cholecalciferol (VITAMIN D) 1000 units tablet Take 1 tablet (1,000 Units total) by mouth daily.    ? meclizine (ANTIVERT) 25 MG tablet Take 1 tablet (25 mg total) by mouth 3 (three) times daily as needed for dizziness. 30 tablet 0  ? Multiple Vitamin (MULTI-VITAMIN) tablet Take 1 tablet by mouth daily.    ? scopolamine (TRANSDERM-SCOP) 1 MG/3DAYS Place 1 patch (1.5 mg total) onto the skin every 3 (three) days. 4 patch 1  ? ?No current facility-administered medications for this visit.  ?  ?Allergies: Latex  ?Past Medical History:  ?Diagnosis Date  ? Breast cancer ([place]) 02/10/2017  ? L BREAST IDC  ? Breast cancer, left ([place]) 01/2017  ? GERD (gastroesophageal reflux disease)   ? Glaucoma   ? no current med.  ? History of radiation therapy 05/06/17-06/04/17  ? left breast 40.05 Gy in 15 fractions, left breast boost 10 Gy in 5 fractions  ? Hyperlipidemia   ? PONV (postoperative nausea and vomiting)   ?  ?Past Surgical History:  ?Procedure Laterality Date  ? BREAST BIOPSY Left 02/10/2017  ? IDC  ? BREAST LUMPECTOMY Left 03/23/2017  ? invasive ductal ca  ? BREAST LUMPECTOMY WITH RADIOACTIVE SEED AND SENTINEL LYMPH NODE BIOPSY Left 03/24/2017  ? Procedure: BREAST PARTIAL MASTECTOMY WITH RADIOACTIVE SEED AND SENTINEL LYMPH NODE BIOPSY;  Surgeon: Erroll Luna, MD;  Location: [facility];  Service: General;  Laterality: Left;  ? CATARACT EXTRACTION W/ INTRAOCULAR LENS  IMPLANT    ?  ?Family History  ?Problem Relation Age of Onset  ? Diabetes Mother   ? Cancer Mother   ? Arthritis Mother   ? Lung cancer Mother   ? Heart attack Mother   ? Heart attack Father   ? Stroke Father   ? Breast cancer Maternal Aunt   ?  ?Social History  ? ?Tobacco Use  ? Smoking status: Former  ?  Types: Cigarettes  ?  Quit date: 05/26/1983  ?  Years since quitting: 38.3  ? Smokeless tobacco: Never  ?Substance Use Topics  ? Alcohol use: Yes  ?  Comment: occasionally  ?  ?Subjective:  ?Presents today as a new patient; needs to get established with new PCP; ?Dr. Matthew Saras at [facility] managing GYN needs; under care of oncology for breast cancer- will be at 5 year mark next year; ?Requesting Rx for patch to help with upcoming [place] cruise; ?Occasional vertigo symptoms- room does spin/ typically responds very well to Sudafed;  ? ? ? ? ?Objective:  ?Vitals:  ? 09/24/21 0950  ?BP: 116/72  ?Pulse: 83  ?Resp: 18  ?Temp: 98.3 ?F (36.8 ?C)  ?TempSrc: Oral  ?SpO2: 99%  ?Weight: 211 lb (95.7 kg)  ?Height: 5' 4.5" (1.638 m)  ?  ?General: Well developed, well nourished, in no acute distress  ?Skin : Warm and dry.  ?Head: Normocephalic and atraumatic  ?Eyes: Sclera and conjunctiva clear; pupils round and  reactive to light; extraocular movements intact  ?Ears: External normal; canals clear; tympanic membranes normal  ?Oropharynx: Pink, supple. No suspicious lesions  ?Neck: Supple without thyromegaly, adenopathy  ?Lungs: Respirations unlabored; clear to auscultation bilaterally without wheeze, rales, rhonchi  ?CVS exam: normal rate and regular rhythm.  ?Neurologic: Alert and oriented; speech intact; face symmetrical; moves all extremities well; CNII-XII intact without focal deficit  ? ?Assessment:  ?1. Colon cancer screening   ?2. Vertigo   ?3. Malignant neoplasm of upper-inner quadrant of left breast in female, estrogen receptor positive ([place])   ?4. Osteopenia of multiple sites   ?  ?Plan:  ?Cologuard order  updated; ?Rx for Antivert 25 mg tid prn; follow up if symptoms worsen;  ?Under care of oncology; ?DEXA has been ordered by oncology;  ? ?Plan for fasting CPE in 6 months;  ? ?Return in about 6 months (around 03/27/2022) for fasting CPE.  ?Orders Placed This Encounter  ?Procedures  ? Cologuard  ?  ?Requested Prescriptions  ? ?Signed Prescriptions Disp Refills  ? meclizine (ANTIVERT) 25 MG tablet 30 tablet 0  ?  Sig: Take 1 tablet (25 mg total) by mouth 3 (three) times daily as needed for dizziness.  ? scopolamine (TRANSDERM-SCOP) 1 MG/3DAYS 4 patch 1  ?  Sig: Place 1 patch (1.5 mg total) onto the skin every 3 (three) days.  ?  ? ?

## 2021-11-11 DIAGNOSIS — Z1211 Encounter for screening for malignant neoplasm of colon: Secondary | ICD-10-CM | POA: Diagnosis not present

## 2021-11-17 LAB — COLOGUARD: COLOGUARD: NEGATIVE

## 2022-03-12 ENCOUNTER — Other Ambulatory Visit: Payer: Self-pay | Admitting: Hematology

## 2022-03-12 DIAGNOSIS — Z1231 Encounter for screening mammogram for malignant neoplasm of breast: Secondary | ICD-10-CM

## 2022-04-11 ENCOUNTER — Ambulatory Visit (INDEPENDENT_AMBULATORY_CARE_PROVIDER_SITE_OTHER): Payer: BC Managed Care – PPO | Admitting: Family

## 2022-04-11 VITALS — BP 124/80 | HR 73 | Temp 98.0°F | Resp 18 | Ht 64.5 in | Wt 213.4 lb

## 2022-04-11 DIAGNOSIS — R7309 Other abnormal glucose: Secondary | ICD-10-CM

## 2022-04-11 DIAGNOSIS — Z13220 Encounter for screening for lipoid disorders: Secondary | ICD-10-CM | POA: Diagnosis not present

## 2022-04-11 DIAGNOSIS — Z0000 Encounter for general adult medical examination without abnormal findings: Secondary | ICD-10-CM

## 2022-04-11 LAB — CBC WITH DIFFERENTIAL/PLATELET
Basophils Absolute: 0 10*3/uL (ref 0.0–0.1)
Basophils Relative: 0.7 % (ref 0.0–3.0)
Eosinophils Absolute: 0.2 10*3/uL (ref 0.0–0.7)
Eosinophils Relative: 2.8 % (ref 0.0–5.0)
HCT: 40.9 % (ref 36.0–46.0)
Hemoglobin: 13.6 g/dL (ref 12.0–15.0)
Lymphocytes Relative: 33.8 % (ref 12.0–46.0)
Lymphs Abs: 1.9 10*3/uL (ref 0.7–4.0)
MCHC: 33.2 g/dL (ref 30.0–36.0)
MCV: 92.6 fl (ref 78.0–100.0)
Monocytes Absolute: 0.5 10*3/uL (ref 0.1–1.0)
Monocytes Relative: 8.3 % (ref 3.0–12.0)
Neutro Abs: 3 10*3/uL (ref 1.4–7.7)
Neutrophils Relative %: 54.4 % (ref 43.0–77.0)
Platelets: 188 10*3/uL (ref 150.0–400.0)
RBC: 4.42 Mil/uL (ref 3.87–5.11)
RDW: 13 % (ref 11.5–15.5)
WBC: 5.6 10*3/uL (ref 4.0–10.5)

## 2022-04-11 LAB — LIPID PANEL
Cholesterol: 260 mg/dL — ABNORMAL HIGH (ref 0–200)
HDL: 98 mg/dL (ref >39.00)
LDL Cholesterol: 145 mg/dL — ABNORMAL HIGH (ref 0–99)
NonHDL: 162.4
Total CHOL/HDL Ratio: 3
Triglycerides: 88 mg/dL (ref 0.0–149.0)
VLDL: 17.6 mg/dL (ref 0.0–40.0)

## 2022-04-11 LAB — COMPREHENSIVE METABOLIC PANEL
ALT: 29 U/L (ref 0–35)
AST: 19 U/L (ref 0–37)
Albumin: 4.7 g/dL (ref 3.5–5.2)
Alkaline Phosphatase: 55 U/L (ref 39–117)
BUN: 22 mg/dL (ref 6–23)
CO2: 29 mEq/L (ref 19–32)
Calcium: 9.9 mg/dL (ref 8.4–10.5)
Chloride: 100 mEq/L (ref 96–112)
Creatinine, Ser: 0.78 mg/dL (ref 0.40–1.20)
GFR: 81.31 mL/min (ref >60.00)
Glucose, Bld: 99 mg/dL (ref 70–99)
Potassium: 5.1 mEq/L (ref 3.5–5.1)
Sodium: 136 mEq/L (ref 135–145)
Total Bilirubin: 0.5 mg/dL (ref 0.2–1.2)
Total Protein: 7.4 g/dL (ref 6.0–8.3)

## 2022-04-11 LAB — HEMOGLOBIN A1C: Hgb A1c MFr Bld: 5.6 % (ref 4.6–6.5)

## 2022-04-11 NOTE — Patient Instructions (Signed)
Please get your Shingrix vaccine at the pharmacy as discussed;  We will let you know about the [facility] once we get your labs back;

## 2022-04-11 NOTE — Progress Notes (Signed)
[name] is a 61 y.o. female with the following history as recorded in EpicCare:  Patient Active Problem List   Diagnosis Date Noted   Osteopenia of multiple sites 09/19/2020   Estrogen deficiency 06/04/2017   Malignant neoplasm of upper-inner quadrant of left breast in female, estrogen receptor positive ([place]) 02/17/2017    Current Outpatient Medications  Medication Sig Dispense Refill   calcium carbonate (CALCIUM 600) 600 MG TABS tablet Take 1 tablet (600 mg total) by mouth 2 (two) times daily with a meal. 60 tablet    cholecalciferol (VITAMIN D) 1000 units tablet Take 1 tablet (1,000 Units total) by mouth daily.     Multiple Vitamin (MULTI-VITAMIN) tablet Take 1 tablet by mouth daily.     No current facility-administered medications for this visit.    Allergies: Latex  Past Medical History:  Diagnosis Date   Breast cancer ([place]) 02/10/2017   L BREAST IDC   Breast cancer, left ([place]) 01/2017   GERD (gastroesophageal reflux disease)    Glaucoma    no current med.   History of radiation therapy 05/06/17-06/04/17   left breast 40.05 Gy in 15 fractions, left breast boost 10 Gy in 5 fractions   Hyperlipidemia    PONV (postoperative nausea and vomiting)     Past Surgical History:  Procedure Laterality Date   BREAST BIOPSY Left 02/10/2017   IDC   BREAST LUMPECTOMY Left 03/23/2017   invasive ductal ca   BREAST LUMPECTOMY WITH RADIOACTIVE SEED AND SENTINEL LYMPH NODE BIOPSY Left 03/24/2017   Procedure: BREAST PARTIAL MASTECTOMY WITH RADIOACTIVE SEED AND SENTINEL LYMPH NODE BIOPSY;  Surgeon: Erroll Luna, MD;  Location: [place];  Service: General;  Laterality: Left;   CATARACT EXTRACTION W/ INTRAOCULAR LENS IMPLANT      Family History  Problem Relation Age of Onset   Diabetes Mother    Cancer Mother    Arthritis Mother    Lung cancer Mother    Heart attack Mother    Heart attack Father    Stroke Father    Breast cancer Maternal Aunt     Social  History   Tobacco Use   Smoking status: Former    Types: Cigarettes    Quit date: 05/26/1983    Years since quitting: 38.9   Smokeless tobacco: Never  Substance Use Topics   Alcohol use: Yes    Comment: occasionally    Subjective:  Patient presents for yearly CPE;  Sees GYN- Dr. Matthew Saras ( [facility]);  Planning to get Shingrix at local pharmacy;  Mammogram is scheduled for January 2024; DEXA scheduled for April 2024;  Would be interested in trying [facility];  Has decided that she does not want to go back her oncologist- was due to be released from their care in 2024;   Review of Systems  Constitutional: Negative.   HENT: Negative.    Eyes: Negative.   Respiratory: Negative.    Cardiovascular: Negative.   Gastrointestinal: Negative.   Genitourinary: Negative.   Musculoskeletal: Negative.   Skin: Negative.   Neurological: Negative.   Endo/Heme/Allergies: Negative.   Psychiatric/Behavioral: Negative.       Objective:  Vitals:   04/11/22 0905  BP: 124/80  Pulse: 73  Resp: 18  Temp: 98 F (36.7 C)  TempSrc: Temporal  SpO2: 97%  Weight: 213 lb 6.4 oz (96.8 kg)  Height: 5' 4.5" (1.638 m)    General: Well developed, well nourished, in no acute distress  Skin : Warm and dry.  Head: Normocephalic and atraumatic  Eyes: Sclera and conjunctiva clear; pupils round and reactive to light; extraocular movements intact  Ears: External normal; canals clear; tympanic membranes normal  Oropharynx: Pink, supple. No suspicious lesions  Neck: Supple without thyromegaly, adenopathy  Lungs: Respirations unlabored; clear to auscultation bilaterally without wheeze, rales, rhonchi  CVS exam: normal rate and regular rhythm.  Abdomen: Soft; nontender; nondistended; normoactive bowel sounds; no masses or hepatosplenomegaly  Musculoskeletal: No deformities; no active joint inflammation  Extremities: No edema, cyanosis, clubbing  Vessels: Symmetric bilaterally  Neurologic: Alert  and oriented; speech intact; face symmetrical; moves all extremities well; CNII-XII intact without focal deficit   Assessment:  1. PE (physical exam), annual   2. Lipid screening   3. Elevated glucose     Plan:  Age appropriate preventive healthcare needs addressed; encouraged regular eye doctor and dental exams; encouraged regular exercise; will update labs and refills as needed today; follow-up to be determined; She plans to get her Shingrix at her pharmacy;  To consider trial of Wegovy based on labs;    No follow-ups on file.  Orders Placed This Encounter  Procedures   CBC with Differential/Platelet   Comp Met (CMET)   Lipid panel   Hemoglobin A1c    Requested Prescriptions    No prescriptions requested or ordered in this encounter

## 2022-04-14 ENCOUNTER — Other Ambulatory Visit: Payer: Self-pay | Admitting: Family

## 2022-04-14 MED ORDER — WEGOVY 0.25 MG/0.5ML ~~LOC~~ SOAJ: 0.2500 mg | SUBCUTANEOUS | 0 refills | Status: DC

## 2022-04-15 ENCOUNTER — Telehealth: Payer: Self-pay | Admitting: Family

## 2022-04-15 ENCOUNTER — Telehealth: Payer: Self-pay | Admitting: *Deleted

## 2022-04-15 NOTE — Telephone Encounter (Signed)
our prior authorization for [name] has been approved! MORE INFO Personalized support and financial assistance may be available through the [employer] program. For more information, and to see program requirements, click on the More Info button to the right.  Message from plan: Effective from 04/15/2022 through 08/18/2022. Please note: This medication is to be titrated up in strength every 4 weeks as tolerated by the member. The quantity limit set of 4 pens per 180 days allows for this titration through all strengths using 4 pens of each strength every 28 days.

## 2022-04-15 NOTE — Telephone Encounter (Signed)
Patient returned nurse's call. Please call back to discuss labs

## 2022-04-15 NOTE — Telephone Encounter (Signed)
Prior auth started via cover my meds.  Awaiting determination.  Key: [id number]

## 2022-04-15 NOTE — Telephone Encounter (Signed)
I have called the pt and informed her [name] has prescribed the medication and we have started a PA on it. We will see if her insurance is going to cover the medication. Now if it is covered; I have given her the expectation that she may have to do some work and call around to see who has it in stock, due to the Pro[employer], television/film/video. Pt reported understanding and we will wait on the insurance determination for now.

## 2022-06-04 DIAGNOSIS — H5213 Myopia, bilateral: Secondary | ICD-10-CM | POA: Diagnosis not present

## 2022-06-04 DIAGNOSIS — H524 Presbyopia: Secondary | ICD-10-CM | POA: Diagnosis not present

## 2022-06-04 DIAGNOSIS — H52223 Regular astigmatism, bilateral: Secondary | ICD-10-CM | POA: Diagnosis not present

## 2022-06-19 ENCOUNTER — Ambulatory Visit
Admission: RE | Admit: 2022-06-19 | Discharge: 2022-06-19 | Disposition: A | Discharge status: Home / Self Care | Payer: BC Managed Care – PPO | Source: Ambulatory Visit | Attending: Hematology | Admitting: Hematology

## 2022-06-19 DIAGNOSIS — Z1231 Encounter for screening mammogram for malignant neoplasm of breast: Secondary | ICD-10-CM | POA: Diagnosis not present

## 2022-06-19 HISTORY — DX: Personal history of irradiation: Z92.3

## 2022-07-07 DIAGNOSIS — Z01419 Encounter for gynecological examination (general) (routine) without abnormal findings: Secondary | ICD-10-CM | POA: Diagnosis not present

## 2022-07-07 DIAGNOSIS — Z6835 Body mass index (BMI) 35.0-35.9, adult: Secondary | ICD-10-CM | POA: Diagnosis not present

## 2022-09-09 ENCOUNTER — Ambulatory Visit
Admission: RE | Admit: 2022-09-09 | Discharge: 2022-09-09 | Disposition: A | Discharge status: Home / Self Care | Payer: BC Managed Care – PPO | Source: Ambulatory Visit | Attending: Nurse Practitioner | Admitting: Nurse Practitioner

## 2022-09-09 DIAGNOSIS — M8589 Other specified disorders of bone density and structure, multiple sites: Secondary | ICD-10-CM

## 2022-09-09 DIAGNOSIS — Z780 Asymptomatic menopausal state: Secondary | ICD-10-CM | POA: Diagnosis not present

## 2022-09-15 ENCOUNTER — Telehealth: Payer: Self-pay

## 2022-09-15 NOTE — Telephone Encounter (Addendum)
Called patient and made her aware of results below. Patient voiced full understanding.   ----- Message from Pollyann Samples, NP sent at 09/15/2022  9:43 AM EDT ----- Please let pt know DEXA is stable from 2022, no significant change in her bone density, and low fracture risk. Please continue daily calcium/vit D and do weight bearing exercise.   Thanks, Clayborn Heron NP

## 2022-10-01 DIAGNOSIS — Z961 Presence of intraocular lens: Secondary | ICD-10-CM | POA: Diagnosis not present

## 2022-10-01 DIAGNOSIS — H26491 Other secondary cataract, right eye: Secondary | ICD-10-CM | POA: Diagnosis not present

## 2022-10-01 DIAGNOSIS — H40003 Preglaucoma, unspecified, bilateral: Secondary | ICD-10-CM | POA: Diagnosis not present

## 2022-10-22 DIAGNOSIS — H401131 Primary open-angle glaucoma, bilateral, mild stage: Secondary | ICD-10-CM | POA: Diagnosis not present

## 2022-10-22 DIAGNOSIS — Z961 Presence of intraocular lens: Secondary | ICD-10-CM | POA: Diagnosis not present

## 2022-11-06 DIAGNOSIS — H401131 Primary open-angle glaucoma, bilateral, mild stage: Secondary | ICD-10-CM | POA: Diagnosis not present

## 2022-11-06 DIAGNOSIS — H26493 Other secondary cataract, bilateral: Secondary | ICD-10-CM | POA: Diagnosis not present

## 2022-11-06 DIAGNOSIS — H26491 Other secondary cataract, right eye: Secondary | ICD-10-CM | POA: Diagnosis not present

## 2023-04-16 ENCOUNTER — Ambulatory Visit: Payer: BC Managed Care – PPO | Admitting: Family

## 2023-04-16 ENCOUNTER — Encounter: Payer: Self-pay | Admitting: Family

## 2023-04-16 VITALS — BP 128/84 | HR 68 | Temp 98.4°F | Resp 18 | Ht 64.5 in | Wt 220.0 lb

## 2023-04-16 DIAGNOSIS — Z0000 Encounter for general adult medical examination without abnormal findings: Secondary | ICD-10-CM

## 2023-04-16 DIAGNOSIS — Z13220 Encounter for screening for lipoid disorders: Secondary | ICD-10-CM

## 2023-04-16 DIAGNOSIS — Z1159 Encounter for screening for other viral diseases: Secondary | ICD-10-CM | POA: Diagnosis not present

## 2023-04-16 DIAGNOSIS — R7309 Other abnormal glucose: Secondary | ICD-10-CM | POA: Diagnosis not present

## 2023-04-16 DIAGNOSIS — Z23 Encounter for immunization: Secondary | ICD-10-CM

## 2023-04-16 DIAGNOSIS — C50212 Malignant neoplasm of upper-inner quadrant of left female breast: Secondary | ICD-10-CM

## 2023-04-16 DIAGNOSIS — Z1231 Encounter for screening mammogram for malignant neoplasm of breast: Secondary | ICD-10-CM

## 2023-04-16 DIAGNOSIS — Z114 Encounter for screening for human immunodeficiency virus [HIV]: Secondary | ICD-10-CM | POA: Diagnosis not present

## 2023-04-16 DIAGNOSIS — H409 Unspecified glaucoma: Secondary | ICD-10-CM | POA: Insufficient documentation

## 2023-04-16 DIAGNOSIS — Z170 Estrogen receptor positive status [ER+]: Secondary | ICD-10-CM

## 2023-04-16 LAB — LIPID PANEL
Cholesterol: 282 mg/dL — ABNORMAL HIGH (ref 0–200)
HDL: 87.2 mg/dL (ref >39.00)
LDL Cholesterol: 173 mg/dL — ABNORMAL HIGH (ref 0–99)
NonHDL: 194.66
Total CHOL/HDL Ratio: 3
Triglycerides: 109 mg/dL (ref 0.0–149.0)
VLDL: 21.8 mg/dL (ref 0.0–40.0)

## 2023-04-16 LAB — COMPREHENSIVE METABOLIC PANEL
ALT: 26 U/L (ref 0–35)
AST: 19 U/L (ref 0–37)
Albumin: 4.7 g/dL (ref 3.5–5.2)
Alkaline Phosphatase: 63 U/L (ref 39–117)
BUN: 18 mg/dL (ref 6–23)
CO2: 27 meq/L (ref 19–32)
Calcium: 10 mg/dL (ref 8.4–10.5)
Chloride: 101 meq/L (ref 96–112)
Creatinine, Ser: 0.76 mg/dL (ref 0.40–1.20)
GFR: 83.29 mL/min (ref >60.00)
Glucose, Bld: 106 mg/dL — ABNORMAL HIGH (ref 70–99)
Potassium: 4.5 meq/L (ref 3.5–5.1)
Sodium: 138 meq/L (ref 135–145)
Total Bilirubin: 0.6 mg/dL (ref 0.2–1.2)
Total Protein: 7.3 g/dL (ref 6.0–8.3)

## 2023-04-16 LAB — HIV ANTIBODY (ROUTINE TESTING W REFLEX): HIV 1&2 Ab, 4th Generation: NONREACTIVE

## 2023-04-16 LAB — CBC WITH DIFFERENTIAL/PLATELET
Basophils Absolute: 0 10*3/uL (ref 0.0–0.1)
Basophils Relative: 0.8 % (ref 0.0–3.0)
Eosinophils Absolute: 0.1 10*3/uL (ref 0.0–0.7)
Eosinophils Relative: 2.6 % (ref 0.0–5.0)
HCT: 41.7 % (ref 36.0–46.0)
Hemoglobin: 13.8 g/dL (ref 12.0–15.0)
Lymphocytes Relative: 33.5 % (ref 12.0–46.0)
Lymphs Abs: 1.8 10*3/uL (ref 0.7–4.0)
MCHC: 33.2 g/dL (ref 30.0–36.0)
MCV: 93.2 fL (ref 78.0–100.0)
Monocytes Absolute: 0.5 10*3/uL (ref 0.1–1.0)
Monocytes Relative: 8.5 % (ref 3.0–12.0)
Neutro Abs: 2.9 10*3/uL (ref 1.4–7.7)
Neutrophils Relative %: 54.6 % (ref 43.0–77.0)
Platelets: 167 10*3/uL (ref 150.0–400.0)
RBC: 4.47 Mil/uL (ref 3.87–5.11)
RDW: 13.6 % (ref 11.5–15.5)
WBC: 5.4 10*3/uL (ref 4.0–10.5)

## 2023-04-16 LAB — HEPATITIS C ANTIBODY: Hepatitis C Ab: NONREACTIVE

## 2023-04-16 LAB — HEMOGLOBIN A1C: Hgb A1c MFr Bld: 5.5 % (ref 4.6–6.5)

## 2023-04-16 NOTE — Progress Notes (Signed)
[name] is a 62 y.o. 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 60 tablet    cholecalciferol (VITAMIN D) 1000 units tablet Take 1 tablet (1,000 Units total) by mouth daily.     latanoprost (XALATAN) 0.005 % ophthalmic solution 1 drop at bedtime.     Multiple Vitamin (MULTI-VITAMIN) tablet Take 1 tablet by mouth daily.     No current facility-administered medications for this visit.    Allergies: Latex  Past Medical History:  Diagnosis Date   Breast cancer (HCC) 02/10/2017   L BREAST IDC   Breast cancer, left (HCC) 01/2017   GERD (gastroesophageal reflux disease)    Glaucoma    no current med.   History of radiation therapy 05/06/17-06/04/17   left breast 40.05 Gy in 15 fractions, left breast boost 10 Gy in 5 fractions   Hyperlipidemia    Personal history of radiation therapy    PONV (postoperative nausea and vomiting)     Past Surgical History:  Procedure Laterality Date   BREAST BIOPSY Left 02/10/2017   IDC   BREAST LUMPECTOMY Left 03/23/2017   invasive ductal ca   BREAST LUMPECTOMY WITH RADIOACTIVE SEED AND SENTINEL LYMPH NODE BIOPSY Left 03/24/2017   Procedure: BREAST PARTIAL MASTECTOMY WITH RADIOACTIVE SEED AND SENTINEL LYMPH NODE BIOPSY;  Surgeon: Harriette Bouillon, MD;  Location: [facility];  Service: General;  Laterality: Left;   CATARACT EXTRACTION W/ INTRAOCULAR LENS IMPLANT      Family History  Problem Relation Age of Onset   Diabetes Mother    Cancer Mother    Arthritis  Mother    Lung cancer Mother    Heart attack Mother    Heart attack Father    Stroke Father    Breast cancer Maternal Aunt     Social History   Tobacco Use   Smoking status: Former    Current packs/day: 0.00    Types: Cigarettes    Quit date: 05/26/1983    Years since quitting: 39.9   Smokeless tobacco: Never  Substance Use Topics   Alcohol use: Yes    Comment: occasionally    Subjective:   Presents for yearly CPE; is up to date on GYN needs; was released by oncologist earlier this year;  Did retire earlier this year;  Does have glaucoma- sees Dr. Carlynn Purl in [facility] every six months;   Review of Systems  Constitutional: Negative.   HENT: Negative.    Eyes: Negative.   Respiratory: Negative.    Cardiovascular: Negative.   Gastrointestinal: Negative.   Genitourinary: Negative.   Musculoskeletal: Negative.   Skin: Negative.   Neurological: Negative.   Endo/Heme/Allergies: Negative.   Psychiatric/Behavioral: Negative.       Objective:  Vitals:   04/16/23 0902  BP: 128/84  Pulse: 68  Resp: 18  Temp: 98.4 F (36.9 C)  TempSrc: Oral  SpO2: 96%  Weight: 220 lb (99.8 kg)  Height: 5' 4.5" (1.638 m)    General: Well developed, well nourished, in no acute distress  Skin :  Warm and dry.  Head: Normocephalic and atraumatic  Eyes: Sclera and conjunctiva clear; pupils round and reactive to light; extraocular movements intact  Ears: External normal; canals clear; tympanic membranes normal  Oropharynx: Pink, supple. No suspicious lesions  Neck: Supple without thyromegaly, adenopathy  Lungs: Respirations unlabored; clear to auscultation bilaterally without wheeze, rales, rhonchi  CVS exam: normal rate and regular rhythm.  Abdomen: Soft; nontender; nondistended; normoactive bowel sounds; no masses or hepatosplenomegaly  Musculoskeletal: No deformities; no active joint inflammation  Extremities: No edema, cyanosis, clubbing  Vessels: Symmetric bilaterally  Neurologic:  Alert and oriented; speech intact; face symmetrical; moves all extremities well; CNII-XII intact without focal deficit   Assessment:  1. PE (physical exam), annual   2. Malignant neoplasm of upper-inner quadrant of left breast in female, estrogen receptor positive (HCC) Chronic  3. Visit for screening mammogram   4. Lipid screening   5. Elevated glucose   6. Encounter for screening for HIV   7. Need for hepatitis C screening test   8. Need for shingles vaccine     Plan:  Age appropriate preventive healthcare needs addressed; encouraged regular eye doctor and dental exams; encouraged regular exercise and weight loss goals; will update labs and refills as needed today; follow-up to be determined; Shingrix #1 given- follow up in 2 month for #2;    Return in about 2 months (around 06/16/2023) for nurse visit Shingrix #2.  Orders Placed This Encounter  Procedures   MM Digital Screening    Standing Status:   Future    Standing Expiration Date:   04/15/2024    Order Specific Question:   Reason for Exam (SYMPTOM  OR DIAGNOSIS REQUIRED)    Answer:   screening mammogram    Order Specific Question:   Preferred imaging location?    Answer:   [facility]   Zoster Recombinant (Shingrix )   CBC with Differential/Platelet   Comp Met (CMET)   Lipid panel   Hemoglobin A1c   HIV Antibody (routine testing w rflx)   Hepatitis C Antibody    Requested Prescriptions    No prescriptions requested or ordered in this encounter

## 2023-05-06 DIAGNOSIS — H401131 Primary open-angle glaucoma, bilateral, mild stage: Secondary | ICD-10-CM | POA: Diagnosis not present

## 2023-05-06 DIAGNOSIS — Z961 Presence of intraocular lens: Secondary | ICD-10-CM | POA: Diagnosis not present

## 2023-05-28 DIAGNOSIS — Z961 Presence of intraocular lens: Secondary | ICD-10-CM | POA: Diagnosis not present

## 2023-05-28 DIAGNOSIS — H401131 Primary open-angle glaucoma, bilateral, mild stage: Secondary | ICD-10-CM | POA: Diagnosis not present

## 2023-06-16 ENCOUNTER — Ambulatory Visit (INDEPENDENT_AMBULATORY_CARE_PROVIDER_SITE_OTHER): Payer: BC Managed Care – PPO

## 2023-06-16 DIAGNOSIS — Z23 Encounter for immunization: Secondary | ICD-10-CM

## 2023-06-16 NOTE — Progress Notes (Signed)
[name] is a 63 y.o. female presents to the office today for Shingrix #2 injection, per physician's orders. Original order: 04/21/23 Shingrix 0.5 mL  IM was administered R Deltoid today. Patient tolerated injection. Patient due for follow up labs/provider appt: No Patient next injection due: n/a  Creft, Melton Alar L

## 2023-06-22 ENCOUNTER — Ambulatory Visit
Admission: RE | Admit: 2023-06-22 | Discharge: 2023-06-22 | Disposition: A | Discharge status: Home / Self Care | Payer: BC Managed Care – PPO | Source: Ambulatory Visit | Attending: Family | Admitting: Family

## 2023-06-22 DIAGNOSIS — Z1231 Encounter for screening mammogram for malignant neoplasm of breast: Secondary | ICD-10-CM

## 2023-08-11 ENCOUNTER — Ambulatory Visit (HOSPITAL_BASED_OUTPATIENT_CLINIC_OR_DEPARTMENT_OTHER)
Admission: RE | Admit: 2023-08-11 | Discharge: 2023-08-11 | Disposition: A | Discharge status: Home / Self Care | Source: Ambulatory Visit | Attending: Family | Admitting: Family

## 2023-08-11 ENCOUNTER — Ambulatory Visit (INDEPENDENT_AMBULATORY_CARE_PROVIDER_SITE_OTHER): Admitting: Family

## 2023-08-11 ENCOUNTER — Encounter: Payer: Self-pay | Admitting: Family

## 2023-08-11 VITALS — BP 120/72 | HR 76 | Temp 97.9°F | Ht 64.5 in | Wt 220.0 lb

## 2023-08-11 DIAGNOSIS — J019 Acute sinusitis, unspecified: Secondary | ICD-10-CM | POA: Diagnosis not present

## 2023-08-11 DIAGNOSIS — R1012 Left upper quadrant pain: Secondary | ICD-10-CM | POA: Diagnosis not present

## 2023-08-11 DIAGNOSIS — R0789 Other chest pain: Secondary | ICD-10-CM | POA: Insufficient documentation

## 2023-08-11 DIAGNOSIS — R918 Other nonspecific abnormal finding of lung field: Secondary | ICD-10-CM | POA: Diagnosis not present

## 2023-08-11 MED ORDER — AMOXICILLIN-POT CLAVULANATE 875-125 MG PO TABS: 1.0000 | ORAL_TABLET | Freq: Two times a day (BID) | ORAL | 0 refills | Status: AC

## 2023-08-11 NOTE — Progress Notes (Signed)
 [name] is a 63 y.o. 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 14 tablet 0   calcium carbonate (CALCIUM 600) 600 MG TABS tablet Take 1 tablet (600 mg total) by mouth 2 (two) times daily with a meal. 60 tablet    cholecalciferol (VITAMIN D) 1000 units tablet Take 1 tablet (1,000 Units total) by mouth daily.     latanoprost (XALATAN) 0.005 % ophthalmic solution 1 drop at bedtime.     Multiple Vitamin (MULTI-VITAMIN) tablet Take 1 tablet by mouth daily.     timolol (TIMOPTIC) 0.5 % ophthalmic solution Place 1 drop into both eyes every morning.     No current facility-administered medications for this visit.    Allergies: Latex  Past Medical History:  Diagnosis Date   Breast cancer (HCC) 02/10/2017   L BREAST IDC   Breast cancer, left (HCC) 01/2017   GERD (gastroesophageal reflux disease)    Glaucoma    no current med.   History of radiation therapy 05/06/17-06/04/17   left breast 40.05 Gy in 15 fractions, left breast boost 10 Gy in 5 fractions   Hyperlipidemia    Personal history of radiation therapy    PONV (postoperative nausea and vomiting)     Past Surgical History:  Procedure Laterality Date   BREAST BIOPSY Left 02/10/2017   IDC   BREAST LUMPECTOMY Left 03/23/2017   invasive ductal ca   BREAST LUMPECTOMY WITH RADIOACTIVE SEED AND SENTINEL LYMPH NODE BIOPSY Left 03/24/2017   Procedure: BREAST PARTIAL MASTECTOMY WITH RADIOACTIVE SEED AND SENTINEL LYMPH NODE BIOPSY;  Surgeon: Harriette Bouillon, MD;  Location: [facility];  Service: General;  Laterality: Left;   CATARACT EXTRACTION W/ INTRAOCULAR LENS IMPLANT      Family History  Problem Relation Age of Onset   Diabetes Mother    Cancer Mother    Arthritis Mother    Lung cancer Mother    Heart attack Mother    Heart attack Father    Stroke Father    Breast cancer Maternal Aunt     Social History   Tobacco Use   Smoking status: Former    Current packs/day: 0.00    Types: Cigarettes    Quit date: 05/26/1983    Years since quitting: 40.2   Smokeless tobacco: Never  Substance Use Topics   Alcohol use: Yes    Comment: occasionally    Subjective:   LUQ pain x 1 week; notes that pain is better today but "still not 100%." No fever; has had some increased constipation; 2 episodes of vomiting in the past month;  Also concerned that has had sinus infection for the past 1-2 weeks; notes that has had some intermittent chest pain for the past week;    Objective:  Vitals:   08/11/23 1328  BP: 120/72  Pulse: 76  Temp: 97.9 F (36.6 C)  TempSrc: Oral  SpO2: 100%  Weight: 220 lb (99.8 kg)  Height: 5' 4.5" (1.638 m)    General: Well developed, well nourished, in no acute distress  Skin : Warm  and dry.  Head: Normocephalic and atraumatic  Eyes: Sclera and conjunctiva clear; pupils round and reactive to light; extraocular movements intact  Ears: External normal; canals clear; tympanic membranes normal  Oropharynx: Pink, supple. No suspicious lesions  Neck: Supple without thyromegaly, adenopathy  Lungs: Respirations unlabored; clear to auscultation bilaterally without wheeze, rales, rhonchi  CVS exam: normal rate and regular rhythm.  Abdomen: Soft; nontender; nondistended; normoactive bowel sounds; no masses or hepatosplenomegaly  Neurologic: Alert and oriented; speech intact; face symmetrical; moves all extremities well; CNII-XII intact without focal deficit   Assessment:  1. LUQ pain   2. Atypical chest pain   3. Acute  sinusitis, recurrence not specified, unspecified location     Plan:  ? Etiology for symptoms; check CBC, CMP, amylase, lipase; will update STAT CXR today; will also need to consider abd/ pelvic CT; Rx for Augmentin 875 mg bid x 7 days- discussed that this could cover sinus infection/ respiratory infection and diverticulitis ( which may explain left sided abdominal pain); follow up to be determined;   No follow-ups on file.  Orders Placed This Encounter  Procedures   DG Chest 2 View    Standing Status:   Future    Number of Occurrences:   1    Expiration Date:   08/10/2024    Reason for Exam (SYMPTOM  OR DIAGNOSIS REQUIRED):   atypical chest pain    Preferred imaging location?:   [facility]   CBC with Differential/Platelet   Comp Met (CMET)   Amylase   Lipase    Requested Prescriptions   Signed Prescriptions Disp Refills   amoxicillin-clavulanate (AUGMENTIN) 875-125 MG tablet 14 tablet 0    Sig: Take 1 tablet by mouth 2 (two) times daily for 7 days.

## 2023-08-12 LAB — CBC WITH DIFFERENTIAL/PLATELET
Basophils Absolute: 0.1 K/uL (ref 0.0–0.1)
Basophils Relative: 1.3 % (ref 0.0–3.0)
Eosinophils Absolute: 0.2 K/uL (ref 0.0–0.7)
Eosinophils Relative: 3.3 % (ref 0.0–5.0)
HCT: 39.5 % (ref 36.0–46.0)
Hemoglobin: 13.2 g/dL (ref 12.0–15.0)
Lymphocytes Relative: 28.1 % (ref 12.0–46.0)
Lymphs Abs: 1.9 K/uL (ref 0.7–4.0)
MCHC: 33.5 g/dL (ref 30.0–36.0)
MCV: 91.8 fl (ref 78.0–100.0)
Monocytes Absolute: 0.6 K/uL (ref 0.1–1.0)
Monocytes Relative: 8.5 % (ref 3.0–12.0)
Neutro Abs: 3.9 K/uL (ref 1.4–7.7)
Neutrophils Relative %: 58.8 % (ref 43.0–77.0)
Platelets: 193 K/uL (ref 150.0–400.0)
RBC: 4.3 Mil/uL (ref 3.87–5.11)
RDW: 13.2 % (ref 11.5–15.5)
WBC: 6.7 K/uL (ref 4.0–10.5)

## 2023-08-12 LAB — COMPREHENSIVE METABOLIC PANEL
ALT: 21 U/L (ref 0–35)
AST: 16 U/L (ref 0–37)
Albumin: 4.4 g/dL (ref 3.5–5.2)
Alkaline Phosphatase: 68 U/L (ref 39–117)
BUN: 20 mg/dL (ref 6–23)
CO2: 27 meq/L (ref 19–32)
Calcium: 9.5 mg/dL (ref 8.4–10.5)
Chloride: 102 meq/L (ref 96–112)
Creatinine, Ser: 0.86 mg/dL (ref 0.40–1.20)
GFR: 71.65 mL/min (ref >60.00)
Glucose, Bld: 97 mg/dL (ref 70–99)
Potassium: 4.8 meq/L (ref 3.5–5.1)
Sodium: 138 meq/L (ref 135–145)
Total Bilirubin: 0.4 mg/dL (ref 0.2–1.2)
Total Protein: 7.3 g/dL (ref 6.0–8.3)

## 2023-08-12 LAB — LIPASE: Lipase: 44 U/L (ref 11.0–59.0)

## 2023-08-12 LAB — AMYLASE: Amylase: 46 U/L (ref 27–131)

## 2023-08-14 ENCOUNTER — Other Ambulatory Visit: Payer: Self-pay | Admitting: Family

## 2023-08-14 DIAGNOSIS — R9389 Abnormal findings on diagnostic imaging of other specified body structures: Secondary | ICD-10-CM

## 2023-08-17 ENCOUNTER — Other Ambulatory Visit: Payer: Self-pay | Admitting: Family

## 2023-08-17 ENCOUNTER — Encounter: Payer: Self-pay | Admitting: Family

## 2023-08-17 DIAGNOSIS — R0602 Shortness of breath: Secondary | ICD-10-CM

## 2023-08-17 DIAGNOSIS — R109 Unspecified abdominal pain: Secondary | ICD-10-CM

## 2023-08-18 ENCOUNTER — Ambulatory Visit
Admission: RE | Admit: 2023-08-18 | Discharge: 2023-08-18 | Disposition: A | Discharge status: Home / Self Care | Source: Ambulatory Visit | Attending: Family | Admitting: Family

## 2023-08-18 ENCOUNTER — Encounter: Payer: Self-pay | Admitting: Family

## 2023-08-18 ENCOUNTER — Encounter: Payer: Self-pay | Admitting: Radiology

## 2023-08-18 ENCOUNTER — Other Ambulatory Visit: Payer: Self-pay | Admitting: Family

## 2023-08-18 DIAGNOSIS — R300 Dysuria: Secondary | ICD-10-CM

## 2023-08-18 DIAGNOSIS — R0602 Shortness of breath: Secondary | ICD-10-CM

## 2023-08-18 DIAGNOSIS — R109 Unspecified abdominal pain: Secondary | ICD-10-CM

## 2023-08-18 MED ORDER — IOPAMIDOL (ISOVUE-300) INJECTION 61%
100.0000 mL | Freq: Once | INTRAVENOUS | Status: AC | PRN
  Administered 2023-08-18: 100 mL via INTRAVENOUS

## 2023-08-19 ENCOUNTER — Ambulatory Visit (INDEPENDENT_AMBULATORY_CARE_PROVIDER_SITE_OTHER): Admitting: Family Medicine

## 2023-08-19 ENCOUNTER — Other Ambulatory Visit

## 2023-08-19 ENCOUNTER — Encounter: Payer: Self-pay | Admitting: Family

## 2023-08-19 ENCOUNTER — Other Ambulatory Visit: Payer: Self-pay | Admitting: Emergency Medicine

## 2023-08-19 DIAGNOSIS — R300 Dysuria: Secondary | ICD-10-CM | POA: Diagnosis not present

## 2023-08-19 DIAGNOSIS — R0789 Other chest pain: Secondary | ICD-10-CM | POA: Diagnosis not present

## 2023-08-19 DIAGNOSIS — R0602 Shortness of breath: Secondary | ICD-10-CM

## 2023-08-19 NOTE — Progress Notes (Signed)
 EKG shows NSR, normal axis, no interval abnormalities, no ST segment or T wave changes, good R wave progression.  [name] [name] 1:33 PM 08/19/23

## 2023-08-20 LAB — URINE CULTURE
MICRO NUMBER:: 16250364
SPECIMEN QUALITY:: ADEQUATE

## 2023-08-21 ENCOUNTER — Encounter: Payer: Self-pay | Admitting: Family

## 2023-11-25 DIAGNOSIS — H52221 Regular astigmatism, right eye: Secondary | ICD-10-CM | POA: Diagnosis not present

## 2023-11-25 DIAGNOSIS — H5213 Myopia, bilateral: Secondary | ICD-10-CM | POA: Diagnosis not present

## 2023-11-25 DIAGNOSIS — H524 Presbyopia: Secondary | ICD-10-CM | POA: Diagnosis not present

## 2024-01-18 DIAGNOSIS — Z01419 Encounter for gynecological examination (general) (routine) without abnormal findings: Secondary | ICD-10-CM | POA: Diagnosis not present

## 2024-01-18 DIAGNOSIS — Z6836 Body mass index (BMI) 36.0-36.9, adult: Secondary | ICD-10-CM | POA: Diagnosis not present

## 2024-01-19 DIAGNOSIS — L570 Actinic keratosis: Secondary | ICD-10-CM | POA: Diagnosis not present

## 2024-01-19 DIAGNOSIS — L821 Other seborrheic keratosis: Secondary | ICD-10-CM | POA: Diagnosis not present

## 2024-01-19 DIAGNOSIS — D2261 Melanocytic nevi of right upper limb, including shoulder: Secondary | ICD-10-CM | POA: Diagnosis not present

## 2024-01-19 DIAGNOSIS — D2262 Melanocytic nevi of left upper limb, including shoulder: Secondary | ICD-10-CM | POA: Diagnosis not present

## 2024-02-15 DIAGNOSIS — R1909 Other intra-abdominal and pelvic swelling, mass and lump: Secondary | ICD-10-CM | POA: Diagnosis not present

## 2024-02-29 DIAGNOSIS — H401131 Primary open-angle glaucoma, bilateral, mild stage: Secondary | ICD-10-CM | POA: Diagnosis not present

## 2024-02-29 DIAGNOSIS — Z961 Presence of intraocular lens: Secondary | ICD-10-CM | POA: Diagnosis not present

## 2024-03-21 ENCOUNTER — Ambulatory Visit: Payer: Self-pay

## 2024-03-21 NOTE — Telephone Encounter (Signed)
 FYI Only or Action Required?: Action required by provider: request for appointment.  Patient was last seen in primary care on 08/19/2023 by Frann Mabel Mt, DO.  Called Nurse Triage reporting Abdominal Pain.  Symptoms began a week ago.  Interventions attempted: Nothing.  Symptoms are: unchanged. Was recently catheterized, concerned about UTI.  Triage Disposition: See Physician Within 24 Hours  Patient/caregiver understands and will follow disposition?:      Copied from CRM [id number]. Topic: Clinical - Red Word Triage >> Mar 21, 2024  1:57 PM China J wrote: [facility] that prompted transfer to Nurse Triage: Patient has been having discomfort and cramping on her lower abdominal area. She believes she has a UTI. Reason for Disposition  [1] MODERATE pain (e.g., interferes with normal activities) AND [2] pain comes and goes (cramps) AND [3] present > 24 hours  (Exception: Pain with Vomiting or Diarrhea - see that Guideline.)  Answer Assessment - Initial Assessment Questions 1. LOCATION: Where does it hurt?      LOWER 2. RADIATION: Does the pain shoot anywhere else? (e.g., chest, back)     no 3. ONSET: When did the pain begin? (e.g., minutes, hours or days ago)      2 Weeks 4. SUDDEN: Gradual or sudden onset?     gradual 5. PATTERN Does the pain come and go, or is it constant?     Constant  - mild 6. SEVERITY: How bad is the pain?  (e.g., Scale 1-10; mild, moderate, or severe)     mild 7. RECURRENT SYMPTOM: Have you ever had this type of stomach pain before? If Yes, ask: When was the last time? and What happened that time?      no 8. CAUSE: What do you think is causing the stomach pain? (e.g., gallstones, recent abdominal surgery)     unsure 9. RELIEVING/AGGRAVATING FACTORS: What makes it better or worse? (e.g., antacids, bending or twisting motion, bowel movement)     no 10. OTHER SYMPTOMS: Do you have any other symptoms? (e.g., back pain, diarrhea,  fever, urination pain, vomiting)       no 11. PREGNANCY: Is there any chance you are pregnant? When was your last menstrual period?       no  Protocols used: Abdominal Pain - [facility]

## 2024-03-22 ENCOUNTER — Encounter: Payer: Self-pay | Admitting: Family Medicine

## 2024-03-22 ENCOUNTER — Ambulatory Visit: Admitting: Family Medicine

## 2024-03-22 VITALS — BP 140/70 | HR 68 | Temp 97.9°F | Ht 64.5 in | Wt 209.0 lb

## 2024-03-22 DIAGNOSIS — R1030 Lower abdominal pain, unspecified: Secondary | ICD-10-CM

## 2024-03-22 LAB — POC URINALSYSI DIPSTICK (AUTOMATED)
Bilirubin, UA: NEGATIVE
Blood, UA: NEGATIVE
Glucose, UA: NEGATIVE
Ketones, UA: NEGATIVE
Leukocytes, UA: NEGATIVE
Nitrite, UA: NEGATIVE
Protein, UA: NEGATIVE
Spec Grav, UA: <=1.005 — AB (ref 1.010–1.025)
Urobilinogen, UA: 0.2 U/dL
pH, UA: 7 (ref 5.0–8.0)

## 2024-03-22 NOTE — Progress Notes (Unsigned)
 Chief Complaint: Bladder problems  History of Present Illness:  [name] is a 63 y.o. female who is seen in consultation from Murray, Leita Repine, FNP (Inactive) for evaluation of large urinary residual volume.  [facility].  Thought to have a ovarian cyst.  Pelvic ultrasound revealed a large residual urine volume.  Prior to that, she was not having significant urinary symptomatology.  She did have some pressure after the In-N-Out catheterization was done.  Her current urinary symptoms include a slow stream, but she feels like she empties well.  No urgency, frequency, no frequent urinary tract infections.  CT abdomen and pelvis earlier this year revealed fair bladder volume but no hydronephrosis.  She has normal renal function.   Past Medical History:  Past Medical History:  Diagnosis Date   Breast cancer (HCC) 02/10/2017   L BREAST IDC   Breast cancer, left (HCC) 01/2017   GERD (gastroesophageal reflux disease)    Glaucoma    no current med.   History of radiation therapy 05/06/17-06/04/17   left breast 40.05 Gy in 15 fractions, left breast boost 10 Gy in 5 fractions   Hyperlipidemia    Personal history of radiation therapy    PONV (postoperative nausea and vomiting)     Past Surgical History:  Past Surgical History:  Procedure Laterality Date   BREAST BIOPSY Left 02/10/2017   IDC   BREAST LUMPECTOMY Left 03/23/2017   invasive ductal ca   BREAST LUMPECTOMY WITH RADIOACTIVE SEED AND SENTINEL LYMPH NODE BIOPSY Left 03/24/2017   Procedure: BREAST PARTIAL MASTECTOMY WITH RADIOACTIVE SEED AND SENTINEL LYMPH NODE BIOPSY;  Surgeon: Vanderbilt Ned, MD;  Location: [facility];  Service: General;  Laterality: Left;   CATARACT EXTRACTION W/ INTRAOCULAR LENS IMPLANT      Allergies:  Allergies  Allergen Reactions   Latex Other (See Comments)    EYE IRRITATION    Family History:   Family History  Problem Relation Age of Onset   Diabetes Mother    Cancer Mother    Arthritis Mother    Lung cancer Mother    Heart attack Mother    Heart attack Father    Stroke Father    Breast cancer Maternal Aunt     Social History:  Social History   Tobacco Use   Smoking status: Former    Current packs/day: 0.00    Types: Cigarettes    Quit date: 05/26/1983    Years since quitting: 40.8   Smokeless tobacco: Never  Vaping Use   Vaping status: Never Used  Substance Use Topics   Alcohol use: Yes    Comment: occasionally   Drug use: No    Review of symptoms:  Constitutional:  Negative for unexplained weight loss, night sweats, fever, chills ENT:  Negative for nose bleeds, sinus pain, painful swallowing CV:  Negative for chest pain, shortness of breath, exercise intolerance, palpitations, loss of consciousness Resp:  Negative for cough, wheezing, shortness of breath GI:  Negative for nausea, vomiting, diarrhea, bloody stools GU:  Positives noted in HPI; otherwise negative for gross hematuria, dysuria, urinary incontinence Neuro:  Negative for seizures, poor balance, limb weakness, slurred speech Psych:  Negative for lack of energy, depression, anxiety Endocrine:  Negative for polydipsia, polyuria, symptoms of hypoglycemia (dizziness, hunger, sweating) Hematologic:  Negative for anemia, purpura, petechia, prolonged or excessive bleeding, use of anticoagulants  Allergic:  Negative for difficulty breathing or choking  as a result of exposure to anything; no shellfish allergy; no allergic response (rash/itch) to materials, foods  Physical exam: There were no vitals taken for this visit. GENERAL APPEARANCE:  Well appearing, well developed, well nourished, NAD HEENT: Atraumatic, Normocephalic. NECK: Normal appearance LUNGS: Normal inspiratory and expiratory excursion HEART: Regular Rate EXTREMITIES: Moves all extremities well.  Without clubbing, cyanosis, or  edema. NEUROLOGIC:  Alert and oriented x 3, normal gait, CN II-XII grossly intact.  MENTAL STATUS:  Appropriate. SKIN:  Warm, dry and intact.    Results: Results for orders placed or performed in visit on 03/22/24 (from the past 24 hours)  POCT Urinalysis Dipstick (Automated)   Collection Time: 03/22/24 11:57 AM  Result Value Ref Range   Color, UA yellow    Clarity, UA clear    Glucose, UA Negative Negative   Bilirubin, UA negative    Ketones, UA negative    Spec Grav, UA <=1.005 (A) 1.010 - 1.025   Blood, UA negative    pH, UA 7.0 5.0 - 8.0   Protein, UA Negative Negative   Urobilinogen, UA 0.2 0.2 or 1.0 E.U./dL   Nitrite, UA negative    Leukocytes, UA Negative Negative    I have reviewed referring/prior physicians notes  I have reviewed urinalysis--clear  Residual urine volume today 360 mL  I have reviewed prior imaging--prior CT images reviewed with patient  I have reviewed urine culture results  Assessment: Incomplete bladder emptying.  Patient is not symptomatic at all.  This may be longstanding, and is not associated with upper tract abnormalities or renal dysfunction.   Plan: At this point, I do not think further evaluation is necessary as she is not symptomatic.  I have suggested that she double void at least twice a day.  I will have her come back in a few weeks to recheck her symptoms as well as residual urine volume.  If this progresses to a larger residual volume, I will send her for urodynamics.

## 2024-03-22 NOTE — Progress Notes (Signed)
 Acute Office Visit  Subjective:  Patient ID: [name], female    DOB: November 13, 1960  Age: 63 y.o. MRN: [id number]  CC:  Chief Complaint  Patient presents with   Medical Management of Chronic Issues     HPI [name] is here for abdominal pain.  Per the triage, lower abdominal pain began gradually about 1-2 weeks ago and since onset has been constant but mild without any radiation. She was recently catheterized and believes that she may have developed a UTI from this.    Discussed the use of AI scribe software for clinical note transcription with the patient, who gave verbal consent to proceed.  History of Present Illness [name] is a 63 year old female who presents with abdominal pressure and urinary retention.  She has been experiencing abdominal pressure and discomfort for the past two to four weeks. The sensation is noticeable and uncomfortable, but not excruciating. There is no significant back pain or pain during urination, although there are occasional twinges and a burning sensation in the urethra.  Three weeks ago, during a gynecological visit, she was told she might have a cyst, but further evaluation revealed her bladder was full. A catheter was inserted, and 500 cc of urine was drained, despite her feeling that she had emptied her bladder. She discontinued timolol about a week and a half ago after being informed it could cause urinary retention. Since stopping the medication, she feels she is emptying her bladder, but still experiences a sensation of fullness and some cramping.   Her urine is clear yellow with no noticeable odor. No fever, chills, body aches, nausea, vomiting, or upper abdominal pain. No issues with urinary urgency or frequency, and her urinary stream feels normal.     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   History of radiation therapy 05/06/17-06/04/17   left breast 40.05 Gy in 15 fractions, left breast boost 10 Gy in 5 fractions   Hyperlipidemia    Personal history of radiation therapy    PONV (postoperative nausea and vomiting)     Past Surgical History:  Procedure Laterality Date   BREAST BIOPSY Left 02/10/2017   IDC   BREAST LUMPECTOMY Left 03/23/2017   invasive ductal ca   BREAST LUMPECTOMY WITH RADIOACTIVE SEED AND SENTINEL LYMPH NODE BIOPSY Left 03/24/2017   Procedure: BREAST PARTIAL MASTECTOMY WITH RADIOACTIVE SEED AND SENTINEL LYMPH NODE BIOPSY;  Surgeon: Vanderbilt Ned, MD;  Location: [facility];  Service: General;  Laterality: Left;   CATARACT EXTRACTION W/ INTRAOCULAR LENS IMPLANT      Family History  Problem Relation Age of Onset   Diabetes Mother    Cancer Mother    Arthritis Mother    Lung cancer Mother    Heart attack Mother    Heart attack Father    Stroke Father    Breast cancer Maternal Aunt     Social History   Socioeconomic History   Marital status: Married    Spouse name: Not on file   Number of children: 3   Years of education: Not on file   Highest education level: Bachelor's degree (e.g., BA, AB, BS)  Occupational History   Occupation: IT TRAINER  Tobacco Use   Smoking status: Former    Current packs/day: 0.00  Types: Cigarettes    Quit date: 05/26/1983    Years since quitting: 40.8   Smokeless tobacco: Never  Vaping Use   Vaping status: Never Used  Substance and Sexual Activity   Alcohol use: Yes    Comment: occasionally   Drug use: No   Sexual activity: Not on file  Other Topics Concern   Not on file  Social History Narrative   Not on file   Social Drivers of Health   Financial Resource Strain: Low Risk  (08/10/2023)   Overall Financial Resource Strain (CARDIA)    Difficulty of Paying Living Expenses: Not hard at all  Food Insecurity: No Food Insecurity (08/10/2023)   Hunger  Vital Sign    Worried About Running Out of Food in the Last Year: Never true    Ran Out of Food in the Last Year: Never true  Transportation Needs: No Transportation Needs (08/10/2023)   PRAPARE - Administrator, Civil Service (Medical): No    Lack of Transportation (Non-Medical): No  Physical Activity: Unknown (08/10/2023)   Exercise Vital Sign    Days of Exercise per Week: 1 day    Minutes of Exercise per Session: Patient declined  Stress: No Stress Concern Present (08/10/2023)   [employer] of Occupational Health - Occupational Stress Questionnaire    Feeling of Stress : Not at all  Social Connections: Moderately Isolated (08/10/2023)   Social Connection and Isolation Panel    Frequency of Communication with Friends and Family: More than three times a week    Frequency of Social Gatherings with Friends and Family: Twice a week    Attends Religious Services: Never    Database Administrator or Organizations: No    Attends Engineer, Structural: Not on file    Marital Status: Married  Catering Manager Violence: Not on file    ROS All ROS negative except what is listed in the HPI.   Objective:   Today's Vitals: BP (!) 140/70   Pulse 68   Temp 97.9 F (36.6 C) (Oral)   Ht 5' 4.5 (1.638 m)   Wt 209 lb (94.8 kg)   SpO2 100%   BMI 35.32 kg/m   Physical Exam Vitals reviewed.  Constitutional:      Appearance: Normal appearance.  Abdominal:     Palpations: There is no mass.     Tenderness: There is no guarding.     Hernia: No hernia is present.     Comments: Mild suprapubic/bladder distention with some tenderness on palpation   Neurological:     Mental Status: She is alert and oriented to person, place, and time.  Psychiatric:        Mood and Affect: Mood normal.        Behavior: Behavior normal.        Thought Content: Thought content normal.        Judgment: Judgment normal.        Assessment & Plan:   Problem List Items Addressed This  Visit   None Visit Diagnoses       Lower abdominal pain    -  Primary Suspect some urinary retention. She is stable on exam and is voiding throughout the day, but unsure of quantity. UA without signs of infection today. Urgent urology referral with strict ED precautions discussed.     Relevant Orders   POCT Urinalysis Dipstick (Automated) (Completed)   Ambulatory referral to Urology        Follow-up:  Return if symptoms worsen or fail to improve.   Waddell FURY Almarie, DNP, FNP-C  I,Emily Lagle,acting as a neurosurgeon for Waddell KATHEE Almarie, NP.,have documented all relevant documentation on the behalf of Waddell KATHEE Almarie, NP,as directed by  Waddell KATHEE Almarie, NP while in the presence of Waddell KATHEE Almarie, NP.   I, Waddell KATHEE Almarie, NP, have reviewed all documentation for this visit. The documentation on 03/22/2024 for the exam, diagnosis, procedures, and orders are all accurate and complete.

## 2024-03-23 ENCOUNTER — Ambulatory Visit (INDEPENDENT_AMBULATORY_CARE_PROVIDER_SITE_OTHER): Admitting: Urology

## 2024-03-23 VITALS — BP 140/79 | HR 80 | Ht 65.0 in | Wt 209.0 lb

## 2024-03-23 DIAGNOSIS — R339 Retention of urine, unspecified: Secondary | ICD-10-CM

## 2024-03-23 LAB — BLADDER SCAN AMB NON-IMAGING: Scan Result: 358

## 2024-03-24 LAB — URINALYSIS, ROUTINE W REFLEX MICROSCOPIC
Bilirubin, UA: NEGATIVE
Glucose, UA: NEGATIVE
Ketones, UA: NEGATIVE
Leukocytes,UA: NEGATIVE
Nitrite, UA: NEGATIVE
Protein,UA: NEGATIVE
RBC, UA: NEGATIVE
Specific Gravity, UA: 1.01 (ref 1.005–1.030)
Urobilinogen, Ur: 0.2 mg/dL (ref 0.2–1.0)
pH, UA: 7 (ref 5.0–7.5)

## 2024-03-29 DIAGNOSIS — Z961 Presence of intraocular lens: Secondary | ICD-10-CM | POA: Diagnosis not present

## 2024-03-29 DIAGNOSIS — H401131 Primary open-angle glaucoma, bilateral, mild stage: Secondary | ICD-10-CM | POA: Diagnosis not present

## 2024-05-10 DIAGNOSIS — L650 Telogen effluvium: Secondary | ICD-10-CM | POA: Diagnosis not present

## 2024-05-10 DIAGNOSIS — L649 Androgenic alopecia, unspecified: Secondary | ICD-10-CM | POA: Diagnosis not present

## 2024-05-10 DIAGNOSIS — Z79899 Other long term (current) drug therapy: Secondary | ICD-10-CM | POA: Diagnosis not present
